# Patient Record
Sex: Male | Born: 1980 | Race: White | Hispanic: No | Marital: Married
Health system: Midwestern US, Community
[De-identification: ages and names within clinical notes are randomized; demographics above are authoritative.]

## PROBLEM LIST (undated history)

## (undated) DIAGNOSIS — Z01818 Encounter for other preprocedural examination: Secondary | ICD-10-CM

## (undated) DIAGNOSIS — I2699 Other pulmonary embolism without acute cor pulmonale: Principal | ICD-10-CM

## (undated) DIAGNOSIS — Z832 Family history of diseases of the blood and blood-forming organs and certain disorders involving the immune mechanism: Secondary | ICD-10-CM

## (undated) DIAGNOSIS — K2950 Unspecified chronic gastritis without bleeding: Secondary | ICD-10-CM

## (undated) HISTORY — PX: KNEE SURGERY: SHX244

---

## 2013-11-08 ENCOUNTER — Inpatient Hospital Stay
Admit: 2013-11-08 | Discharge: 2013-11-08 | Disposition: A | Discharge status: Home / Self Care | Attending: Emergency Medicine

## 2013-11-08 MED ORDER — IBUPROFEN 800 MG PO TABS
800 MG | ORAL_TABLET | Freq: Three times a day (TID) | ORAL | Status: DC | PRN
Start: 2013-11-08 — End: 2015-03-01

## 2013-11-08 MED ORDER — SULFAMETHOXAZOLE-TRIMETHOPRIM 800-160 MG PO TABS
800-160 MG | ORAL_TABLET | Freq: Two times a day (BID) | ORAL | Status: AC
Start: 2013-11-08 — End: 2013-11-18

## 2013-11-08 NOTE — Discharge Instructions (Signed)
Wound Care: After Your Visit  Your Care Instructions  Taking good care of your wound at home will help it heal quickly and reduce your chance of infection.  The doctor has checked you carefully, but problems can develop later. If you notice any problems or new symptoms, get medical treatment right away.  Follow-up care is a key part of your treatment and safety. Be sure to make and go to all appointments, and call your doctor if you are having problems. It's also a good idea to know your test results and keep a list of the medicines you take.  How can you care for yourself at home?   Clean the area with soap and water 2 times a day unless your doctor gives you different instructions. Don't use hydrogen peroxide or alcohol, which can slow healing.   You may cover the wound with a thin layer of antibiotic ointment, such as bacitracin, and a nonstick bandage.   Apply more ointment and replace the bandage as needed.   Take pain medicines exactly as directed. Some pain is normal with a wound, but do not ignore pain that is getting worse instead of better. You could have an infection.   If the doctor gave you a prescription medicine for pain, take it as prescribed.   If you are not taking a prescription pain medicine, ask your doctor if you can take an over-the-counter medicine.   Your doctor may have closed your wound with stitches (sutures), staples, or skin glue.   If you have stitches, your doctor may remove them after several days to 2 weeks. Or you may have stitches that dissolve on their own.   If you have staples, your doctor may remove them after 7 to 10 days.   If your wound was closed with skin glue, the glue will wear off in a few days to 2 weeks.  When should you call for help?  Call your doctor now or seek immediate medical care if:   You have signs of infection, such as:   Increased pain, swelling, warmth, or redness near the wound.   Red streaks leading from the wound.   Pus draining from  the wound.   A fever.   You bleed so much from your incision that you soak one or more bandages over 2 to 4 hours.  Watch closely for changes in your health, and be sure to contact your doctor if:   The wound is not getting better each day.   Where can you learn more?   Go to [email or web address] and sign in to your MyChart account. Enter M973 in the Search Health Information box to learn more about "Wound Care: After Your Visit."    If you do not have an account, please click on the "Sign Up Now" link.      2006-2014 [employer]. Care instructions adapted under license by [facility]. This care instruction is for use with your licensed healthcare professional. If you have questions about a medical condition or this instruction, always ask your healthcare professional. [employer] disclaims any warranty or liability for your use of this information.  Content Version: 10.0.273164; Last Revised: June 18, 2010

## 2013-11-08 NOTE — ED Provider Notes (Signed)
HPI:  11/08/13,   Time: 7:12 PM         Walter Farmer is a 33 y.o. male presenting to the ED for red open wound in umbilicus, beginning 1 week ago.  The complaint has been persistent, moderate in severity, and worsened by nothing.      ROS:   Pertinent positives and negatives are stated within HPI, all other systems reviewed and are negative.  --------------------------------------------- PAST HISTORY ---------------------------------------------  Past Medical History:  has no past medical history on file.    Past Surgical History:  has past surgical history that includes Tonsillectomy.    Social History:      Family History: family history is not on file.     The patient???s home medications have been reviewed.    Allergies: Review of patient's allergies indicates no known allergies.    -------------------------------------------------- RESULTS -------------------------------------------------  All laboratory and radiology results have been personally reviewed by myself   LABS:  No results found for this visit on 11/08/13.    RADIOLOGY:  Interpreted by Radiologist.       ------------------------- NURSING NOTES AND VITALS REVIEWED ---------------------------   The nursing notes within the ED encounter and vital signs as below have been reviewed.   BP 121/78 mmHg   Pulse 79   Temp(Src) 98.9 ??F (37.2 ??C) (Oral)   Resp 12   Wt 220 lb (99.791 kg)  Oxygen Saturation Interpretation: Normal      ---------------------------------------------------PHYSICAL EXAM--------------------------------------      Constitutional/General: Alert and oriented x3, well appearing, non toxic in NAD  Head: NC/AT  Eyes: PERRL, EOMI  Mouth: Oropharynx clear, handling secretions, no trismus  Neck: Supple, full ROM, no meningeal signs  Pulmonary: Lungs clear to auscultation bilaterally, no wheezes, rales, or rhonchi. Not in respiratory distress  Cardiovascular:  Regular rate and rhythm, no murmurs, gallops, or rubs. 2+ distal pulses  Abdomen:  Soft, non tender, non distended,   Extremities: Moves all extremities x 4. Warm and well perfused  Skin: purulent drainage from wound in umbilicus  Neurologic: GCS 15,  Psych: Normal Affect      ------------------------------ ED COURSE/MEDICAL DECISION MAKING----------------------  Medications - No data to display      Medical Decision Making:    Wound C&S done. Prescription for Septra DS and Ibuprofen given.    Counseling:   The emergency provider has spoken with the patient and discussed today???s results, in addition to providing specific details for the plan of care and counseling regarding the diagnosis and prognosis.  Questions are answered at this time and they are agreeable with the plan.      --------------------------------- IMPRESSION AND DISPOSITION ---------------------------------    IMPRESSION  1. Infected wound, initial encounter [facility])        DISPOSITION  Disposition: Discharge to home  Patient condition is good                  Delma Officer, MD  11/08/13 [phone]

## 2013-11-11 LAB — CULTURE, WOUND

## 2014-02-18 ENCOUNTER — Inpatient Hospital Stay
Admit: 2014-02-18 | Discharge: 2014-02-18 | Disposition: A | Discharge status: Home / Self Care | Attending: Internal Medicine

## 2014-02-18 ENCOUNTER — Encounter: Admit: 2014-02-18

## 2014-02-18 DIAGNOSIS — R0789 Other chest pain: Secondary | ICD-10-CM

## 2014-02-18 LAB — EKG 12-LEAD
ECG Heart Rate: 59 /min
ECG P Duration: 116 ms
ECG QRS Axis: 62 deg
ECG QRS Duration: 96 ms
ECG QT Dispersion: 26 ms
ECG QTC Interval: 376 ms
ECG RR Interval: 1016 ms
P Axis: 55 deg
P-R Interval: 152 ms
Q-T Interval: 380 ms
T Axis: 49 deg

## 2014-02-18 LAB — CBC
Hematocrit: 43.5 % (ref 37.0–54.0)
Hemoglobin: 14.6 g/dL (ref 12.5–16.5)
MCH: 30.6 pg (ref 26.0–35.0)
MCHC: 33.5 % (ref 32.0–34.5)
MCV: 91.2 fL (ref 80.0–99.9)
MPV: 7.9 fL (ref 7.0–12.0)
PLATELET SLIDE REVIEW: NORMAL
Platelets: 289 E9/L (ref 130–450)
RBC: 4.77 E12/L (ref 3.80–5.80)
RDW: 13.3 fL (ref 11.5–15.0)
WBC: 13.3 E9/L — ABNORMAL HIGH (ref 4.5–11.5)

## 2014-02-18 LAB — COMPREHENSIVE METABOLIC PANEL
ALT: 23 U/L (ref 0–40)
AST: 21 U/L (ref 0–39)
Albumin: 4.4 g/dL (ref 3.5–5.2)
Alkaline Phosphatase: 66 U/L (ref 40–129)
Anion Gap: 12 mmol/L (ref 7–16)
BUN: 19 mg/dL (ref 6–20)
CO2: 26 mmol/L (ref 22–29)
Calcium: 9.6 mg/dL (ref 8.6–10.2)
Chloride: 102 mmol/L (ref 98–107)
Creatinine: 1.2 mg/dL (ref 0.7–1.2)
GFR African American: >60
GFR Non-African American: >60 mL/min/{1.73_m2} (ref >=60)
Glucose: 90 mg/dL (ref 74–109)
Potassium: 3.9 mmol/L (ref 3.5–5.0)
Sodium: 140 mmol/L (ref 132–146)
Total Bilirubin: <0.2 mg/dL (ref 0.0–1.2)
Total Protein: 7.3 g/dL (ref 6.4–8.3)

## 2014-02-18 LAB — URINE DRUG SCREEN
Amphetamine Screen, Urine: NOT DETECTED (ref <1000)
Barbiturate Screen, Ur: NOT DETECTED (ref <200)
Benzodiazepine Screen, Urine: NOT DETECTED (ref <200)
Cannabinoid Scrn, Ur: POSITIVE — AB
Cocaine Metabolite Screen, Urine: NOT DETECTED (ref <300)
Methadone Screen, Urine: NOT DETECTED (ref <300)
Opiate Scrn, Ur: NOT DETECTED
PCP Screen, Urine: NOT DETECTED (ref <25)
Propoxyphene Scrn, Ur: NOT DETECTED (ref <300)

## 2014-02-18 LAB — DIFFERENTIAL WITH WBC
Basophils %: 0 % (ref 0–2)
Basophils Absolute: 0 E9/L (ref 0.00–0.20)
Eosinophils %: 6 % (ref 0–6)
Eosinophils Absolute: 0.8 E9/L — ABNORMAL HIGH (ref 0.05–0.50)
Lymphocytes %: 51 % — ABNORMAL HIGH (ref 20–42)
Lymphocytes Absolute: 6.78 E9/L — ABNORMAL HIGH (ref 1.50–4.00)
Monocytes %: 1 % — ABNORMAL LOW (ref 2–12)
Monocytes Absolute: 0.13 E9/L (ref 0.10–0.95)
Neutrophils %: 42 % — ABNORMAL LOW (ref 43–80)
Neutrophils Absolute: 5.59 E9/L (ref 1.80–7.30)
RBC Morphology: NORMAL

## 2014-02-18 LAB — TROPONIN: Troponin: <0.01 ng/mL (ref 0.00–0.03)

## 2014-02-18 LAB — D-DIMER, QUANTITATIVE: D-Dimer, Quant: <200 ng/mL DDU

## 2014-02-18 MED ORDER — ACETAMINOPHEN-CODEINE #3 300-30 MG PO TABS
300-30 MG | Freq: Once | ORAL | Status: AC
Start: 2014-02-18 — End: 2014-02-18
  Administered 2014-02-18: 08:00:00 1 via ORAL

## 2014-02-18 MED ORDER — ACETAMINOPHEN-CODEINE 300-30 MG PO TABS
300-30 MG | ORAL_TABLET | ORAL | Status: DC | PRN
Start: 2014-02-18 — End: 2015-02-16

## 2014-02-18 MED ORDER — KETOROLAC TROMETHAMINE 30 MG/ML IJ SOLN
30 MG/ML | Freq: Once | INTRAMUSCULAR | Status: AC
Start: 2014-02-18 — End: 2014-02-18
  Administered 2014-02-18: 07:00:00 30 mg via INTRAVENOUS

## 2014-02-18 MED ORDER — IBUPROFEN 800 MG PO TABS
800 MG | ORAL_TABLET | Freq: Three times a day (TID) | ORAL | Status: DC | PRN
Start: 2014-02-18 — End: 2015-03-01

## 2014-02-18 MED FILL — ACETAMINOPHEN-CODEINE #3 300-30 MG PO TABS: 300-30 MG | ORAL | Qty: 1

## 2014-02-18 MED FILL — KETOROLAC TROMETHAMINE 30 MG/ML IJ SOLN: 30 MG/ML | INTRAMUSCULAR | Qty: 1

## 2014-02-18 NOTE — ED Provider Notes (Signed)
[facility] ED  eMERGENCY dEPARTMENT eNCOUnter      Pt Name: Walter Farmer  MRN: [id number]  Birthdate 08/04/1980  Date of evaluation: 02/18/2014  Provider: Marylee Floras, MD    CHIEF COMPLAINT       Chief Complaint   Patient presents with   ??? Chest Pain     started 2 hours ago.  L. sided.  tingling of fingers L. hand         HISTORY OF PRESENT ILLNESS  (Location/Symptom, Timing/Onset, Context/Setting, Quality, Duration, Modifying Factors, Severity.)   Walter Farmer is a 33 y.o. male who presents to the emergency department with sudden onset of left sided chest pain described as a sharp stabbing pain in the inframammary area with radiation to the mid axillary line. He describes the pain as a sharp stabbing pain 6 out of 10 at its worst without radiation however he did describe paresthesias bilateral upper extremities after the onset of the symptoms. The pain has persisted prompting him to report to the emergency department. He denies any cough hemoptysis, swollen or painful legs and denies any recent surgery or prolonged bed rest. He denies adamantly that he is not a user of illicit drugs but does smoke cigarettes routinely.    HPI    Nursing Notes were reviewed and I agree.    REVIEW OF SYSTEMS    (2-9 systems for level 4, 10 or more for level 5)     Review of Systems   Constitutional: Negative for fever, chills, diaphoresis, activity change, appetite change and fatigue.   HENT: Negative for congestion, hearing loss, postnasal drip, rhinorrhea, sinus pressure, sore throat and trouble swallowing.    Eyes: Negative for discharge, redness and visual disturbance.   Respiratory: Negative for apnea, cough, chest tightness, shortness of breath and wheezing.    Cardiovascular: Positive for chest pain. Negative for palpitations and leg swelling.   Gastrointestinal: Negative for nausea and vomiting.   Genitourinary: Negative for dysuria, urgency, frequency and flank pain.   Musculoskeletal: Negative for  myalgias, arthralgias and neck stiffness.   Skin: Negative for color change and pallor.   Neurological: Positive for numbness. Negative for dizziness, tremors, seizures, syncope, weakness and light-headedness.   Hematological: Negative for adenopathy. Does not bruise/bleed easily.   All other systems reviewed and are negative.    Except as noted above the remainder of the review of systems was reviewed and negative.       PAST MEDICAL HISTORY   History reviewed. No pertinent past medical history.      SURGICAL HISTORY       Past Surgical History   Procedure Laterality Date   ??? Tonsillectomy     ??? Knee surgery Right          CURRENT MEDICATIONS       Previous Medications    IBUPROFEN (ADVIL;MOTRIN) 800 MG TABLET    Take 1 tablet by mouth every 8 hours as needed for Pain       ALLERGIES     Review of patient's allergies indicates no known allergies.    FAMILY HISTORY     History reviewed. No pertinent family history.       SOCIAL HISTORY       History     Social History   ??? Marital Status: Single     Spouse Name: N/A     Number of Children: N/A   ??? Years of Education: N/A     Social  History Main Topics   ??? Smoking status: Heavy Tobacco Smoker -- 0.50 packs/day   ??? Smokeless tobacco: None   ??? Alcohol Use: No   ??? Drug Use: No   ??? Sexual Activity:     Partners: Female     Other Topics Concern   ??? None     Social History Narrative         PHYSICAL EXAM    (up to 7 for level 4, 8 or more for level 5)   ED Triage Vitals   BP Temp Temp Source Pulse Resp SpO2 Height Weight   02/18/14 0128 02/18/14 0128 02/18/14 0128 02/18/14 0128 02/18/14 0128 02/18/14 0128 02/18/14 0128 02/18/14 0128   133/75 mmHg 98.6 ??F (37 ??C) Oral 77 20 99 % 5\' 8"  (1.727 m) 215 lb (97.523 kg)       Physical Exam   Constitutional: He is oriented to person, place, and time. He appears well-developed and well-nourished.   HENT:   Head: Normocephalic and atraumatic.   Right Ear: External ear normal.   Left Ear: External ear normal.   Eyes: Conjunctivae and  EOM are normal. Pupils are equal, round, and reactive to light. Right eye exhibits no discharge. Left eye exhibits no discharge.   Neck: Normal range of motion. Neck supple. No JVD present.   Cardiovascular: Normal rate, regular rhythm, normal heart sounds and intact distal pulses.  Exam reveals no gallop and no friction rub.    No murmur heard.  Pulmonary/Chest: Effort normal and breath sounds normal. No respiratory distress. He has no wheezes. He has no rales. He exhibits tenderness.   Pain on palpation at the inframammary line mid clavicular no crepitus is appreciated no deformity.   Abdominal: Soft. Bowel sounds are normal. He exhibits no distension and no mass. There is no tenderness. There is no rebound and no guarding.   Musculoskeletal: Normal range of motion. He exhibits no edema or tenderness.   Neurological: He is alert and oriented to person, place, and time. He has normal reflexes. No cranial nerve deficit. He exhibits normal muscle tone. Coordination normal.   Skin: Skin is warm and dry. No rash noted. No erythema.   Psychiatric: He has a normal mood and affect. Judgment normal.   Nursing note and vitals reviewed.        DIFFERENTIAL DIAGNOSIS   Vitals:    Filed Vitals:    02/18/14 0128   BP: 133/75   Pulse: 77   Temp: 98.6 ??F (37 ??C)   TempSrc: Oral   Resp: 20   Height: 5\' 8"  (1.727 m)   Weight: 215 lb (97.523 kg)   SpO2: 99%           MDM  Number of Diagnoses or Management Options  Chest wall pain:   Pleuritic pain:   Diagnosis management comments: This is a 33 year old male with no medical history of significance and presents with 2 hours of a pleuritic type pain of the left inframammary area which she was concerned about when he developed paresthesias of the upper extremities which he thinks was bilateral but can't say for sure. He has no cardiovascular risk factors other than tobacco use and family history. He felt perfectly fine prior to the onset of these symptoms and rates the pain at this 0.5  out of 10 worsened with palpation. His EKG is reassuring and shows no obvious ischemia.  Chest x-ray has suggestion of air space disease but no pneumothorax or effusion is noted.  He has a slight leukocytosis 13.3 and no anemia. Based on the patient's EKG vital signs physical exam and cardiovascular risk factors as well as the fact that this is reproducible with palpation I have very low suspicion for cardiac etiology.  D-dimer as well as troponin are negative. The patient's pain is now abated. I have suspicion for this being inflammation of the chest wall based on the history present illness, physical exam, EKG and other diagnostic. He is encouraged to follow up with his primary care doctor within 24-48 hours and to return to the emergency department if any change in symptoms occurs particularly if he develops any shortness of breath or hemoptysis.        DIAGNOSTIC RESULTS     EKG: All EKG's are interpreted by Marylee FlorasMichael Melvine Julin, MD in the absence of a cardiologist.    Vital sinus rhythm 59 bpm normal axis no hypertrophy no ischemia noted.    RADIOLOGY:   Non-plain film images such as CT, Ultrasound and MRI are read by the radiologist. Plain radiographic images are visualized and preliminarily interpreted Rozanna BoerbyMichael Marvell Tamer, MD with the below findings:    X-ray shows no obvious consolidation effusion pneumothorax.    Interpretation per the Radiologist below, if available at the time of this note:    XR CHEST STANDARD TWO VW    (Results Pending)         ED BEDSIDE ULTRASOUND:   Performed by ED Physician - none    LABS:  Labs Reviewed   CBC - Abnormal; Notable for the following:     WBC 13.3 (*)     All other components within normal limits   URINE DRUG SCREEN   COMPREHENSIVE METABOLIC PANEL   TROPONIN   D-DIMER, QUANTITATIVE   DIFFERENTIAL WITH WBC       All other labs were within normal range or not returned as of this dictation.    EMERGENCY DEPARTMENT COURSE and DIFFERENTIAL DIAGNOSIS/MDM:   Vitals:    Filed Vitals:     02/18/14 0128   BP: 133/75   Pulse: 77   Temp: 98.6 ??F (37 ??C)   TempSrc: Oral   Resp: 20   Height: 5\' 8"  (1.727 m)   Weight: 215 lb (97.523 kg)   SpO2: 99%           CONSULTS:  None    PROCEDURES:  Unless otherwise noted below, none     Procedures      FINAL IMPRESSION    No diagnosis found.      DISPOSITION/PLAN   DISPOSITION     PATIENT REFERRED TO:  No follow-up provider specified.    DISCHARGE MEDICATIONS:  New Prescriptions    No medications on file       (Please note that portions of this note were completed with a voice recognition program.  Efforts were made to edit the dictations but occasionally words are mis-transcribed.)    Marylee FlorasMichael Jereline Ticer, MD  Attending Emergency Physician        Marylee FlorasMichael Crestina Strike, MD  02/18/14 [phone]

## 2014-02-18 NOTE — Progress Notes (Signed)
To x-ray via cart

## 2014-02-21 LAB — CANNABINOID, URINE, CONFIRMATION: Cannabinoids Conf Ur: 178 ng/mL

## 2015-02-15 ENCOUNTER — Emergency Department
Admission: EM | Admit: 2015-02-15 | Discharge: 2015-02-15 | Discharge status: Against Medical Advice | Payer: Medicaid - Out of State | Attending: Emergency Medicine | Admitting: Emergency Medicine

## 2015-02-15 ENCOUNTER — Encounter: Payer: Self-pay | Admitting: Urgent Care

## 2015-02-15 ENCOUNTER — Emergency Department: Payer: Medicaid - Out of State

## 2015-02-15 ENCOUNTER — Other Ambulatory Visit: Payer: Self-pay

## 2015-02-15 DIAGNOSIS — J159 Unspecified bacterial pneumonia: Secondary | ICD-10-CM | POA: Insufficient documentation

## 2015-02-15 DIAGNOSIS — J189 Pneumonia, unspecified organism: Secondary | ICD-10-CM

## 2015-02-15 DIAGNOSIS — R079 Chest pain, unspecified: Secondary | ICD-10-CM | POA: Diagnosis present

## 2015-02-15 DIAGNOSIS — F17200 Nicotine dependence, unspecified, uncomplicated: Secondary | ICD-10-CM | POA: Insufficient documentation

## 2015-02-15 DIAGNOSIS — I2699 Other pulmonary embolism without acute cor pulmonale: Secondary | ICD-10-CM | POA: Diagnosis not present

## 2015-02-15 LAB — BASIC METABOLIC PANEL
Anion gap: 8 (ref 5–15)
BUN: 12 mg/dL (ref 6–20)
CHLORIDE: 106 mmol/L (ref 101–111)
CO2: 23 mmol/L (ref 22–32)
CREATININE: 1.01 mg/dL (ref 0.61–1.24)
Calcium: 9 mg/dL (ref 8.9–10.3)
GFR calc non Af Amer: >60 mL/min (ref >60)
Glucose, Bld: 106 mg/dL — ABNORMAL HIGH (ref 65–99)
POTASSIUM: 3.9 mmol/L (ref 3.5–5.1)
Sodium: 137 mmol/L (ref 135–145)

## 2015-02-15 LAB — CBC
HEMATOCRIT: 44.8 % (ref 40.0–52.0)
Hemoglobin: 15.2 g/dL (ref 13.0–18.0)
MCH: 30.5 pg (ref 26.0–34.0)
MCHC: 33.8 g/dL (ref 32.0–36.0)
MCV: 90.1 fL (ref 80.0–100.0)
PLATELETS: 310 10*3/uL (ref 150–440)
RBC: 4.98 MIL/uL (ref 4.40–5.90)
RDW: 13.4 % (ref 11.5–14.5)
WBC: 11.1 10*3/uL — ABNORMAL HIGH (ref 3.8–10.6)

## 2015-02-15 LAB — TROPONIN I: Troponin I: <0.03 ng/mL (ref <0.031)

## 2015-02-15 LAB — FIBRIN DERIVATIVES D-DIMER (ARMC ONLY): Fibrin derivatives D-dimer (ARMC): 330 (ref 0–499)

## 2015-02-15 MED ORDER — RIVAROXABAN 15 MG PO TABS: 15.0000 mg | ORAL_TABLET | Freq: Every day | ORAL | Status: AC

## 2015-02-15 MED ORDER — ASPIRIN 81 MG PO CHEW
324.0000 mg | CHEWABLE_TABLET | Freq: Once | ORAL | Status: AC
  Administered 2015-02-15: 324 mg via ORAL

## 2015-02-15 MED ORDER — RIVAROXABAN 15 MG PO TABS
15.0000 mg | ORAL_TABLET | Freq: Once | ORAL | Status: AC
  Administered 2015-02-15: 15 mg via ORAL
  Filled 2015-02-15: qty 1

## 2015-02-15 MED ORDER — LEVOFLOXACIN IN D5W 500 MG/100ML IV SOLN
500.0000 mg | Freq: Once | INTRAVENOUS | Status: AC
  Administered 2015-02-15: 500 mg via INTRAVENOUS
  Filled 2015-02-15: qty 100

## 2015-02-15 MED ORDER — LEVOFLOXACIN 500 MG PO TABS: 500.0000 mg | ORAL_TABLET | Freq: Every day | ORAL | Status: AC

## 2015-02-15 MED ORDER — IOHEXOL 350 MG/ML SOLN
100.0000 mL | Freq: Once | INTRAVENOUS | Status: AC | PRN
  Administered 2015-02-15: 100 mL via INTRAVENOUS

## 2015-02-15 MED ORDER — ASPIRIN 81 MG PO CHEW
CHEWABLE_TABLET | ORAL | Status: AC
Start: 2015-02-15 — End: 2015-02-15
  Administered 2015-02-15: 324 mg via ORAL
  Filled 2015-02-15: qty 4

## 2015-02-15 NOTE — ED Notes (Signed)
Pt left AMA due to having pet in car.  Pt states he lives in [place] and will be returning there today.  Dr Manson PasseyBrown counseled against leaving.  Pt states he will go directly to ER in [place].  Pt given paperwork to take to ED in [place].  Instructions given to pt regarding prescriptions for xarelto and levaquin.  Voiced understanding.  Teach back verified.  No questions or concerns at this time.  Pt understands importance of following up with ED in [place].

## 2015-02-15 NOTE — ED Notes (Signed)
Pt back from CT

## 2015-02-15 NOTE — ED Notes (Signed)
Patient transported to CT 

## 2015-02-15 NOTE — Discharge Instructions (Signed)
Community-Acquired Pneumonia, Adult Pneumonia is an infection of the lungs. There are different types of pneumonia. One type can develop while a person is in a hospital. A different type, called community-acquired pneumonia, develops in people who are not, or have not recently been, in the hospital or other health care facility.  CAUSES Pneumonia may be caused by bacteria, viruses, or funguses. Community-acquired pneumonia is often caused by Streptococcus pneumonia bacteria. These bacteria are often passed from one person to another by breathing in droplets from the cough or sneeze of an infected person. RISK FACTORS The condition is more likely to develop in:  People who havechronic diseases, such as chronic obstructive pulmonary disease (COPD), asthma, congestive heart failure, cystic fibrosis, diabetes, or kidney disease.  People who haveearly-stage or late-stage HIV.  People who havesickle cell disease.  People who havehad their spleen removed (splenectomy).  People who havepoor Human resources officer.  People who havemedical conditions that increase the risk of breathing in (aspirating) secretions their own mouth and nose.   People who havea weakened immune system (immunocompromised).  People who smoke.  People whotravel to areas where pneumonia-causing germs commonly exist.  People whoare around animal habitats or animals that have pneumonia-causing germs, including birds, bats, rabbits, cats, and farm animals. SYMPTOMS Symptoms of this condition include:  Adry cough.  A wet (productive) cough.  Fever.  Sweating.  Chest pain, especially when breathing deeply or coughing.  Rapid breathing or difficulty breathing.  Shortness of breath.  Shaking chills.  Fatigue.  Muscle aches. DIAGNOSIS Your health care provider will take a medical history and perform a physical exam. You may also have other tests, including:  Imaging studies of your chest, including  X-rays.  Tests to check your blood oxygen level and other blood gases.  Other tests on blood, mucus (sputum), fluid around your lungs (pleural fluid), and urine. If your pneumonia is severe, other tests may be done to identify the specific cause of your illness. TREATMENT The type of treatment that you receive depends on many factors, such as the cause of your pneumonia, the medicines you take, and other medical conditions that you have. For most adults, treatment and recovery from pneumonia may occur at home. In some cases, treatment must happen in a hospital. Treatment may include:  Antibiotic medicines, if the pneumonia was caused by bacteria.  Antiviral medicines, if the pneumonia was caused by a virus.  Medicines that are given by mouth or through an IV tube.  Oxygen.  Respiratory therapy. Although rare, treating severe pneumonia may include:  Mechanical ventilation. This is done if you are not breathing well on your own and you cannot maintain a safe blood oxygen level.  Thoracentesis. This procedureremoves fluid around one lung or both lungs to help you breathe better. HOME CARE INSTRUCTIONS  Take over-the-counter and prescription medicines only as told by your health care provider.  Only takecough medicine if you are losing sleep. Understand that cough medicine can prevent your body's natural ability to remove mucus from your lungs.  If you were prescribed an antibiotic medicine, take it as told by your health care provider. Do not stop taking the antibiotic even if you start to feel better.  Sleep in a semi-upright position at night. Try sleeping in a reclining chair, or place a few pillows under your head.  Do not use tobacco products, including cigarettes, chewing tobacco, and e-cigarettes. If you need help quitting, ask your health care provider.  Drink enough water to keep your urine  clear or pale yellow. This will help to thin out mucus secretions in your  lungs. PREVENTION There are ways that you can decrease your risk of developing community-acquired pneumonia. Consider getting a pneumococcal vaccine if:  You are older than 35 years of age.  You are older than 36 years of age and are undergoing cancer treatment, have chronic lung disease, or have other medical conditions that affect your immune system. Ask your health care provider if this applies to you. There are different types and schedules of pneumococcal vaccines. Ask your health care provider which vaccination option is best for you. You may also prevent community-acquired pneumonia if you take these actions:  Get an influenza vaccine every year. Ask your health care provider which type of influenza vaccine is best for you.  Go to the dentist on a regular basis.  Wash your hands often. Use hand sanitizer if soap and water are not available. SEEK MEDICAL CARE IF:  You have a fever.  You are losing sleep because you cannot control your cough with cough medicine. SEEK IMMEDIATE MEDICAL CARE IF:  You have worsening shortness of breath.  You have increased chest pain.  Your sickness becomes worse, especially if you are an older adult or have a weakened immune system.  You cough up blood.   This information is not intended to replace advice given to you by your health care provider. Make sure you discuss any questions you have with your health care provider.   Document Released: 02/11/2005 Document Revised: 11/02/2014 Document Reviewed: 06/08/2014 Elsevier Interactive Patient Education 2016 [employer]  Pulmonary Embolism A pulmonary embolism (PE) is a sudden blockage or decrease of blood flow in one lung or both lungs. Most blockages come from a blood clot that travels from the legs or the pelvis to the lungs. PE is a dangerous and potentially life-threatening condition if it is not treated right away. CAUSES A pulmonary embolism occurs most commonly when a blood clot  travels from one of your veins to your lungs. Rarely, PE is caused by air, fat, amniotic fluid, or part of a tumor traveling through your veins to your lungs. RISK FACTORS A PE is more likely to develop in:  People who smoke.  People who areolder, especially over 22 years of age.  People who are overweight (obese).  People who sit or lie still for a long time, such as during long-distance travel (over 4 hours), bed rest, hospitalization, or during recovery from certain medical conditions like a stroke.  People who do not engage in much physical activity (sedentary lifestyle).  People who have chronic breathing disorders.  People whohave a personal or family history of blood clots or blood clotting disease.  People whohave peripheral vascular disease (PVD), diabetes, or some types of cancer.  People who haveheart disease, especially if the person had a recent heart attack or has congestive heart failure.  People who have neurological diseases that affect the legs (leg paresis).  People who have had a traumatic injury, such as breaking a hip or leg.  People whohave recently had major or lengthy surgery, especially on the hip, knee, or abdomen.  People who have hada central line placed inside a large vein.  People who takemedicines that contain the hormone estrogen. These include birth control pills and hormone replacement therapy.  Pregnancy or during childbirth or the postpartum period. SIGNS AND SYMPTOMS  The symptoms of a PE usually start suddenly and include:  Shortness of breath while active  or at rest.  Coughing or coughing up blood or blood-tinged mucus.  Chest pain that is often worse with deep breaths.  Rapid or irregular heartbeat.  Feeling light-headed or dizzy.  Fainting.  Feelinganxious.  Sweating. There may also be pain and swelling in a leg if that is where the blood clot started. These symptoms may represent a serious problem that is an  emergency. Do not wait to see if the symptoms will go away. Get medical help right away. Call your local emergency services (911 in the [place] Do not drive yourself to the hospital. DIAGNOSIS Your health care provider will take a medical history and perform a physical exam. You may also have other tests, including:  Blood tests to assess the clotting properties of your blood, assess oxygen levels in your blood, and find blood clots.  Imaging tests, such as CT, ultrasound, MRI, X-ray, and other tests to see if you have clots anywhere in your body.  An electrocardiogram (ECG) to look for heart strain from blood clots in the lungs. TREATMENT The main goals of PE treatment are:  To stop a blood clot from growing larger.  To stop new blood clots from forming. The type of treatment that you receive depends on many factors, such as the cause of your PE, your risk for bleeding or developing more clots, and other medical conditions that you have. Sometimes, a combination of treatments is necessary. This condition may be treated with:  Medicines, including newer oral blood thinners (anticoagulants), warfarin, low molecular weight heparins, thrombolytics, or heparins.  Wearing compression stockings or using different types of devices.  Surgery (rare) to remove the blood clot or to place a filter in your abdomen to stop the blood clot from traveling to your lungs. Treatments for a PE are often divided into immediate treatment, long-term treatment (up to 3 months after PE), and extended treatment (more than 3 months after PE). Your treatment may continue for several months. This is called maintenance therapy, and it is used to prevent the forming of new blood clots. You can work with your health care provider to choose the treatment program that is best for you. What are anticoagulants? Anticoagulants are medicines that treat PEs. They can stop current blood clots from growing and stop new clots from  forming. They cannot dissolve existing clots. Your body dissolves clots by itself over time. Anticoagulants are given by mouth, by injection, or through an IV tube. What are thrombolytics? Thrombolytics are clot-dissolving medicines that are used to dissolve a PE. They carry a high risk of bleeding, so they tend to be used only in severe cases or if you have very low blood pressure. HOME CARE INSTRUCTIONS If you are taking a newer oral anticoagulant:  Take the medicine every single day at the same time each day.  Understand what foods and drugs interact with this medicine.  Understand that there are no regular blood tests required when using this medicine.  Understandthe side effects of this medicine, including excessive bruising or bleeding. Ask your health care provider or pharmacist about other possible side effects. If you are taking warfarin:  Understand how to take warfarin and know which foods can affect how warfarin works in Public relations account executive.  Understand that it is dangerous to taketoo much or too little warfarin. Too much warfarin increases the risk of bleeding. Too little warfarin continues to allow the risk for blood clots.  Follow your PT and INR blood testing schedule. The PT and INR  results allow your health care provider to adjust your dose of warfarin. It is very important that you have your PT and INR tested as often as told by your health care provider.  Avoid major changes in your diet, or tell your health care provider before you change your diet. Arrange a visit with a registered dietitian to answer your questions. Many foods, especially foods that are high in vitamin K, can interfere with warfarin and affect the PT and INR results. Eat a consistent amount of foods that are high in vitamin K, such as:  Spinach, kale, broccoli, cabbage, collard greens, turnip greens, Brussels sprouts, peas, cauliflower, seaweed, and parsley.  Beef liver and pork liver.  Green  tea.  Soybean oil.  Tell your health care provider about any and all medicines, vitamins, and supplements that you take, including aspirin and other over-the-counter anti-inflammatory medicines. Be especially cautious with aspirin and anti-inflammatory medicines. Do not take those before you ask your health care provider if it is safe to do so. This is important because many medicines can interfere with warfarin and affect the PT and INR results.  Do not start or stop taking any over-the-counter or prescription medicine unless your health care provider or pharmacist tells you to do so. If you take warfarin, you will also need to do these things:  Hold pressure over cuts for longer than usual.  Tell your dentist and other health care providers that you are taking warfarin before you have any procedures in which bleeding may occur.  Avoid alcohol or drink very small amounts. Tell your health care provider if you change your alcohol intake.  Do not use tobacco products, including cigarettes, chewing tobacco, and e-cigarettes. If you need help quitting, ask your health care provider.  Avoid contact sports. General Instructions  Take over-the-counter and prescription medicines only as told by your health care provider. Anticoagulant medicines can have side effects, including easy bruising and difficulty stopping bleeding. If you are prescribed an anticoagulant, you will also need to do these things:  Hold pressure over cuts for longer than usual.  Tell your dentist and other health care providers that you are taking anticoagulants before you have any procedures in which bleeding may occur.  Avoid contact sports.  Wear a medical alert bracelet or carry a medical alert card that says you have had a PE.  Ask your health care provider how soon you can go back to your normal activities. Stay active to prevent new blood clots from forming.  Make sure to exercise while traveling or when you have  been sitting or standing for a long period of time. It is very important to exercise. Exercise your legs by walking or by tightening and relaxing your leg muscles often. Take frequent walks.  Wear compression stockings as told by your health care provider to help prevent more blood clots from forming.  Do not use tobacco products, including cigarettes, chewing tobacco, and e-cigarettes. If you need help quitting, ask your health care provider.  Keep all follow-up appointments with your health care provider. This is important. PREVENTION Take these actions to decrease your risk of developing another PE:  Exercise regularly. For at least 30 minutes every day, engage in:  Activity that involves moving your arms and legs.  Activity that encourages good blood flow through your body by increasing your heart rate.  Exercise your arms and legs every hour during long-distance travel (over 4 hours). Drink plenty of water and avoid drinking alcohol  while traveling.  Avoid sitting or lying in bed for long periods of time without moving your legs.  Maintain a weight that is appropriate for your height. Ask your health care provider what weight is healthy for you.  If you are a woman who is over 35 years of age, avoid unnecessary use of medicines that contain estrogen. These include birth control pills.  Do not smoke, especially if you take estrogen medicines. If you need help quitting, ask your health care provider.  If you are at very high risk for PE, wear compression stockings.  If you recently had a PE, have regularly scheduled ultrasound testing on your legs to check for new blood clots. If you are hospitalized, prevention measures may include:  Early walking after surgery, as soon as your health care provider says that it is safe.  Receiving anticoagulants to prevent blood clots. If you cannot take anticoagulants, other options may be available, such as wearing compression stockings or  using different types of devices. SEEK IMMEDIATE MEDICAL CARE IF:  You have new or increased pain, swelling, or redness in an arm or leg.  You have numbness or tingling in an arm or leg.  You have shortness of breath while active or at rest.  You have chest pain.  You have a rapid or irregular heartbeat.  You feel light-headed or dizzy.  You cough up blood.  You notice blood in your vomit, bowel movement, or urine.  You have a fever. These symptoms may represent a serious problem that is an emergency. Do not wait to see if the symptoms will go away. Get medical help right away. Call your local emergency services (911 in the [place] Do not drive yourself to the hospital.   This information is not intended to replace advice given to you by your health care provider. Make sure you discuss any questions you have with your health care provider.   Document Released: 02/09/2000 Document Revised: 11/02/2014 Document Reviewed: 06/08/2014 Elsevier Interactive Patient Education [employer]2016 [employer] Inc[employer]

## 2015-02-15 NOTE — ED Notes (Signed)
Patient presents with c/o chest pressure that started yesterday. Patient reports that [place] and began to have LEFT sided CP. Denies N/V, SOB, and diaphoresis.

## 2015-02-15 NOTE — ED Provider Notes (Signed)
[facility] Emergency Department Provider Note  ____________________________________________  Time seen: 3:10 AM  I have reviewed the triage vital signs and the nursing notes.   HISTORY  Chief Complaint Chest Pain    HPI Jeremy Tate is a 34 y.o. male presents with left-sided chest pain described as pressure now currently 5 out of 10 since yesterday. Patient states that he drove from [place] to [place] and then back to [place] over the course of the last 24 hours. Patient denies any dyspnea no nausea vomiting or diaphoresis. Of note patient states that his mother has a clotting disorder that has resulted in multiple pulmonary emboli. Patient however has never had a DVT or PE.    Past medical history None There are no active problems to display for this patient.   Past Surgical History  Procedure Laterality Date  . Knee surgery      No current outpatient prescriptions on file.  Allergies No known drug allergies No family history on file.  Social History Social History  Substance Use Topics  . Smoking status: Current Every Day Smoker  . Smokeless tobacco: None  . Alcohol Use: No    Review of Systems  Constitutional: Negative for fever. Eyes: Negative for visual changes. ENT: Negative for sore throat. Cardiovascular: positive for chest pain. Respiratory: Negative for shortness of breath. Gastrointestinal: Negative for abdominal pain, vomiting and diarrhea. Genitourinary: Negative for dysuria. Musculoskeletal: Negative for back pain. Skin: Negative for rash. Neurological: Negative for headaches, focal weakness or numbness.   10-point ROS otherwise negative.  ____________________________________________   PHYSICAL EXAM:  VITAL SIGNS: ED Triage Vitals  Enc Vitals Group     BP 02/15/15 0232 129/72 mmHg     Pulse Rate 02/15/15 0232 64     Resp 02/15/15 0232 16     Temp 02/15/15 0232 98.6 F (37 C)     Temp Source 02/15/15 0232  Oral     SpO2 02/15/15 0232 97 %     Weight 02/15/15 0232 220 lb (99.791 kg)     Height 02/15/15 0232  (1.727 m)     Head Cir --      Peak Flow --      Pain Score 02/15/15 0232 8     Pain Loc --      Pain Edu? --      Excl. in GC? --      Constitutional: Alert and oriented. Well appearing and in no distress. Eyes: Conjunctivae are normal. PERRL. Normal extraocular movements. ENT   Head: Normocephalic and atraumatic.   Nose: No congestion/rhinnorhea.   Mouth/Throat: Mucous membranes are moist.   Neck: No stridor. Hematological/Lymphatic/Immunilogical: No cervical lymphadenopathy. Cardiovascular: Normal rate, regular rhythm. Normal and symmetric distal pulses are present in all extremities. No murmurs, rubs, or gallops. Respiratory: Normal respiratory effort without tachypnea nor retractions. Breath sounds are clear and equal bilaterally. No wheezes/rales/rhonchi. Gastrointestinal: Soft and nontender. No distention. There is no CVA tenderness. Genitourinary: deferred Musculoskeletal: Nontender with normal range of motion in all extremities. No joint effusions.  No lower extremity tenderness nor edema. Neurologic:  Normal speech and language. No gross focal neurologic deficits are appreciated. Speech is normal.  Skin:  Skin is warm, dry and intact. No rash noted. Psychiatric: Mood and affect are normal. Speech and behavior are normal. Patient exhibits appropriate insight and judgment.  ____________________________________________    LABS (pertinent positives/negatives)  Labs Reviewed  BASIC METABOLIC PANEL - Abnormal; Notable for the following:  Glucose, Bld 106 (*)    All other components within normal limits  CBC - Abnormal; Notable for the following:    WBC 11.1 (*)    All other components within normal limits  TROPONIN I  FIBRIN DERIVATIVES D-DIMER ([facility] ONLY)     ____________________________________________   EKG ED ECG REPORT I, Naketa Daddario,  Cro[place] N, the attending physician, personally viewed and interpreted this ECG.   Date: 02/15/2015  EKG Time: 2:57AM  Rate: 65  Rhythm: Normal Sinus rhythm  Axis: None  Intervals:normal  ST&T Change: none    ____________________________________________    RADIOLOGY CT Angio Chest PE W/Cm &/Or Wo Cm (Final result) Result time: 02/15/15 04:18:59   Final result by Rad Results In Interface (02/15/15 04:18:59)   Narrative:   CLINICAL DATA: 34 year old male with chest pressure and shortness of breath. Left-sided chest pain. History of clotting disorder.  EXAM: CT ANGIOGRAPHY CHEST WITH CONTRAST  TECHNIQUE: Multidetector CT imaging of the chest was performed using the standard protocol during bolus administration of intravenous contrast. Multiplanar CT image reconstructions and MIPs were obtained to evaluate the vascular anatomy.  CONTRAST: 100mL OMNIPAQUE IOHEXOL 350 MG/ML SOLN  COMPARISON: Chest radiograph dated 02/15/2015  FINDINGS: There is a cluster of nodularity in the left upper lobe most compatible with an infectious process possibly an atypical pneumonia. Correlation. with clinical exam and sputum cultures recommended. There is diffuse ground-glass opacities of the lungs with small areas of air trapping which may represent small vessel versus a small airway disease. There is no focal consolidation no pleural effusion or pneumothorax. The central airways are patent.  The thoracic aorta appears unremarkable. The right lower lobe subsegmental pulmonary artery branch embolus identified. There is no cardiomegaly or pericardial effusion. Top-normal left hilar lymph node. There is no mediastinal adenopathy. The visualized esophagus appears unremarkable.  There is no axillary adenopathy. The chest wall soft tissues appear unremarkable. The osseous structures are intact.  The visualized upper abdomen is grossly unremarkable.  Review of the MIP images confirms  the above findings.  IMPRESSION: Small right lower lobe subsegmental pulmonary artery branches embolus. No evidence of right cardiac strain.  Focal subpleural left upper lobe cluster of nodules concerning for pneumonia. Clinical correlation is recommended.  Critical Value/emergent results were called by telephone at the time of interpretation on 02/15/2015 at 4:16 am to Dr. Dolores FrameSung, who verbally acknowledged these results.   Electronically Signed By: Elgie CollardArash Radparvar M.D. On: 02/15/2015 04:18          DG Chest Port 1 View (Final result) Result time: 02/15/15 03:25:43   Final result by Rad Results In Interface (02/15/15 03:25:43)   Narrative:   CLINICAL DATA: 34 year old male with chest pain and pressure.  EXAM: PORTABLE CHEST 1 VIEW  COMPARISON: None.  FINDINGS: The heart size and mediastinal contours are within normal limits. Both lungs are clear. The visualized skeletal structures are unremarkable.  IMPRESSION: No active disease.   Electronically Signed By: Elgie CollardArash Radparvar M.D. On: 02/15/2015 03:25           Critical Care performed: CRITICAL CARE Performed by: Bayard MalesBROWN, [place] N   Total critical care time: 60 minutes  Critical care time was exclusive of separately billable procedures and treating other patients.  Critical care was necessary to treat or prevent imminent or life-threatening deterioration.  Critical care was time spent personally by me on the following activities: development of treatment plan with patient and/or surrogate as well as nursing, discussions with consultants, evaluation of patient's response to  treatment, examination of patient, obtaining history from patient or surrogate, ordering and performing treatments and interventions, ordering and review of laboratory studies, ordering and review of radiographic studies, pulse oximetry and re-evaluation of patient's  condition.  ____________________________________________   INITIAL IMPRESSION / ASSESSMENT AND PLAN / ED COURSE  Pertinent labs & imaging results that were available during my care of the patient were reviewed by me and considered in my medical decision making (see chart for details).  I informed the patient and CT scan findings of a pulmonary emboli as well as pneumonia. I told the patient that he needed to be in the hospital however he stated that he could not stay in the hospital. I informed the patient of the potential dangerous outcomes of his decision however he repeatedly that he cannot stay in hospital and that his plan is to return to [place] today. Given Patient's decision to leave against medical advice he will be prescribed Xarelto and Levaquin. Patient was advised to follow-up with a emergency department or primary care provider on arrival to [place]  ____________________________________________   FINAL CLINICAL IMPRESSION(S) / ED DIAGNOSES  Final diagnoses:  Pulmonary embolism on right [facility])  Community acquired pneumonia      Darci Current, MD 02/15/15 [phone]

## 2015-02-16 ENCOUNTER — Emergency Department: Admit: 2015-02-16

## 2015-02-16 ENCOUNTER — Inpatient Hospital Stay
Admit: 2015-02-16 | Discharge: 2015-02-16 | Disposition: A | Discharge status: Home / Self Care | Attending: Emergency Medicine

## 2015-02-16 DIAGNOSIS — R079 Chest pain, unspecified: Secondary | ICD-10-CM

## 2015-02-16 LAB — COMPREHENSIVE METABOLIC PANEL
ALT: 21 U/L (ref 0–40)
AST: 15 U/L (ref 0–39)
Albumin: 4.3 g/dL (ref 3.5–5.2)
Alkaline Phosphatase: 62 U/L (ref 40–129)
Anion Gap: 14 mmol/L (ref 7–16)
BUN: 11 mg/dL (ref 6–20)
CO2: 23 mmol/L (ref 22–29)
Calcium: 9.3 mg/dL (ref 8.6–10.2)
Chloride: 101 mmol/L (ref 98–107)
Creatinine: 1.1 mg/dL (ref 0.7–1.2)
GFR African American: >60
GFR Non-African American: >60 mL/min/{1.73_m2} (ref >=60)
Glucose: 94 mg/dL (ref 74–109)
Potassium: 3.8 mmol/L (ref 3.5–5.0)
Sodium: 138 mmol/L (ref 132–146)
Total Bilirubin: 0.3 mg/dL (ref 0.0–1.2)
Total Protein: 7.2 g/dL (ref 6.4–8.3)

## 2015-02-16 LAB — RESPIRATORY PANEL, MOLECULAR
Film Array Adenovirus: NOT DETECTED
Film Array Bordetella Pertusis: NOT DETECTED
Film Array Chlamydophilia Pneumoniae: NOT DETECTED
Film Array Conoravirus NL63: NOT DETECTED
Film Array Coronavirus 229E: NOT DETECTED
Film Array Coronavirus HKU1: NOT DETECTED
Film Array Coronavirus OC43: NOT DETECTED
Film Array Influenza A Virus 09H1: NOT DETECTED
Film Array Influenza A Virus H1: NOT DETECTED
Film Array Influenza A Virus H3: NOT DETECTED
Film Array Influenza A Virus: NOT DETECTED
Film Array Influenza B: NOT DETECTED
Film Array Metapneumovirus: NOT DETECTED
Film Array Mycoplasma Pneumoniae: NOT DETECTED
Film Array Parainfluenza Virus 1: NOT DETECTED
Film Array Parainfluenza Virus 2: NOT DETECTED
Film Array Parainfluenza Virus 3: NOT DETECTED
Film Array Parainfluenza Virus 4: NOT DETECTED
Film Array Respiratory Syncitial Virus: NOT DETECTED
Film Array Rhinovirus/Enterovirus: NOT DETECTED

## 2015-02-16 LAB — URINALYSIS
Bilirubin Urine: NEGATIVE
Glucose, Ur: NEGATIVE mg/dL
Ketones, Urine: NEGATIVE mg/dL
Leukocyte Esterase, Urine: NEGATIVE
Nitrite, Urine: NEGATIVE
Protein, UA: NEGATIVE mg/dL
Specific Gravity, UA: <=1.005 (ref 1.005–1.030)
Urobilinogen, Urine: 0.2 E.U./dL (ref <2.0)
pH, UA: 6 (ref 5.0–9.0)

## 2015-02-16 LAB — CBC WITH AUTO DIFFERENTIAL
Basophils %: 0.5 % (ref 0.0–2.0)
Basophils Absolute: 0.04 E9/L (ref 0.00–0.20)
Eosinophils %: 3.1 % (ref 0.0–6.0)
Eosinophils Absolute: 0.27 E9/L (ref 0.05–0.50)
Hematocrit: 42.9 % (ref 37.0–54.0)
Hemoglobin: 15.1 g/dL (ref 12.5–16.5)
Immature Granulocytes #: 0.01 E9/L
Immature Granulocytes %: 0.1 % (ref 0.0–5.0)
Lymphocytes %: 39.5 % (ref 20.0–42.0)
Lymphocytes Absolute: 3.44 E9/L (ref 1.50–4.00)
MCH: 30.9 pg (ref 26.0–35.0)
MCHC: 35.2 % — ABNORMAL HIGH (ref 32.0–34.5)
MCV: 87.9 fL (ref 80.0–99.9)
MPV: 9.3 fL (ref 7.0–12.0)
Monocytes %: 8.3 % (ref 2.0–12.0)
Monocytes Absolute: 0.72 E9/L (ref 0.10–0.95)
Neutrophils %: 48.5 % (ref 43.0–80.0)
Neutrophils Absolute: 4.22 E9/L (ref 1.80–7.30)
Platelets: 316 E9/L (ref 130–450)
RBC: 4.88 E12/L (ref 3.80–5.80)
RDW: 12.3 fL (ref 11.5–15.0)
WBC: 8.7 E9/L (ref 4.5–11.5)

## 2015-02-16 LAB — URINE DRUG SCREEN
Amphetamine Screen, Urine: NOT DETECTED (ref <1000)
Barbiturate Screen, Ur: NOT DETECTED (ref <200)
Benzodiazepine Screen, Urine: NOT DETECTED (ref <200)
Cannabinoid Scrn, Ur: POSITIVE — AB
Cocaine Metabolite Screen, Urine: NOT DETECTED (ref <300)
Methadone Screen, Urine: NOT DETECTED (ref <300)
Opiate Scrn, Ur: NOT DETECTED
PCP Screen, Urine: NOT DETECTED (ref <25)
Propoxyphene Scrn, Ur: NOT DETECTED (ref <300)

## 2015-02-16 LAB — MICROSCOPIC URINALYSIS

## 2015-02-16 LAB — LACTIC ACID: Lactic Acid: 1 mmol/L (ref 0.5–2.2)

## 2015-02-16 LAB — PROTIME-INR
INR: 1.2
Protime: 13.4 s — ABNORMAL HIGH (ref 9.3–12.4)

## 2015-02-16 LAB — TROPONIN: Troponin: <0.01 ng/mL (ref 0.00–0.03)

## 2015-02-16 LAB — APTT: aPTT: 33.2 s (ref 24.5–35.1)

## 2015-02-16 LAB — BRAIN NATRIURETIC PEPTIDE: Pro-BNP: 11 pg/mL (ref 0–125)

## 2015-02-16 MED ORDER — RIVAROXABAN 15 MG PO TABS
15 MG | ORAL_TABLET | Freq: Two times a day (BID) | ORAL | 0 refills | Status: DC
Start: 2015-02-16 — End: 2015-02-17

## 2015-02-16 MED ORDER — OXYCODONE-ACETAMINOPHEN 5-325 MG PO TABS
5-325 MG | ORAL_TABLET | ORAL | 0 refills | Status: DC
Start: 2015-02-16 — End: 2015-03-01

## 2015-02-16 MED ORDER — ONDANSETRON HCL 4 MG/2ML IJ SOLN
4 MG/2ML | Freq: Once | INTRAMUSCULAR | Status: AC
Start: 2015-02-16 — End: 2015-02-16
  Administered 2015-02-16: 09:00:00 4 mg via INTRAVENOUS

## 2015-02-16 MED ORDER — SODIUM CHLORIDE 0.9 % IV BOLUS
0.9 % | Freq: Once | INTRAVENOUS | Status: AC
Start: 2015-02-16 — End: 2015-02-16
  Administered 2015-02-16: 08:00:00 1000 mL via INTRAVENOUS

## 2015-02-16 MED ORDER — IOVERSOL 68 % IV SOLN
68 % | Freq: Once | INTRAVENOUS | Status: AC | PRN
Start: 2015-02-16 — End: 2015-02-16
  Administered 2015-02-16: 09:00:00 70 mL via INTRAVENOUS

## 2015-02-16 MED ORDER — MORPHINE SULFATE (PF) 4 MG/ML IV SOLN
4 MG/ML | Freq: Once | INTRAVENOUS | Status: AC
Start: 2015-02-16 — End: 2015-02-16
  Administered 2015-02-16: 09:00:00 4 mg via INTRAVENOUS

## 2015-02-16 MED FILL — ONDANSETRON HCL 4 MG/2ML IJ SOLN: 4 MG/2ML | INTRAMUSCULAR | Qty: 2

## 2015-02-16 MED FILL — MORPHINE SULFATE (PF) 4 MG/ML IV SOLN: 4 mg/mL | INTRAVENOUS | Qty: 1

## 2015-02-16 NOTE — ED Notes (Signed)
Reviewed discharge instructions. Patient verbalized understanding.      Tobyn Osgood Marko PlumeM Dattilo, RN  02/16/15 [phone]

## 2015-02-16 NOTE — ED Notes (Signed)
Warm blankets given per request.      Jodi Marble, RN  02/16/15 [phone]

## 2015-02-16 NOTE — ED Provider Notes (Signed)
ED Attending  ??  Department of Emergency Medicine   ED  Provider Note  Admit Date/Time: 02/16/2015  2:19 AM  ED Bed: 20/20   MRN: [id number]  Chief Complaint:   Shortness of Breath (diagnosed with right sided PE yesterday in [place], signed out [facility] and drove here, SOB now worse) and Chest Pain (worsening today, mid chest )       History of Present Illness   Source of history provided by:  patient.  History/Exam Limitations: none.       Walter Farmer is a 34 y.o. male who has a past medical history of: History reviewed. No pertinent past medical history. presents to the ED by private vehicle for chest pain, beginning a few days ago.  The complaint has been persistent, moderate in severity.  Patient states that he was diagnosed with a pulmonary embolism while in a [facility] hospital yesterday. He states that he wanted to come back home because he had family members with him, and wanted to be close by. States it is a pressure in his chest that is associated with a cough that is productive of sputum but not hemoptysis. He denies any associated fevers or chills. He denies palpitations, fluttering, near-syncope or any syncopal events. He denies pain in the abdomen, nausea vomiting, change in bowel pattern, blood in the stools or black tarry stools. Denies dysuria, frank hematuria, suprapubic or flank pains.    The patient brings records from [facility]. He was prescribed Xarelto  15 mg tablets and Levaquin 500 mg tablets for treatment of the pulmonary embolism and pneumonia likely. Patient also brings a copy of the CT angiogram of the chest report. The impression of the report is as follows: Small right lower lobe subsegmental pulmonary artery branches embolus. No evidence of right cardiac strain. Focal subpleural left upper lobe cluster of nodules concerning for pneumonia. Correlate clinically.    ROS   Pertinent positives and negatives are stated within HPI, all other systems reviewed  and are negative.       Past Surgical History   Procedure Laterality Date   ??? Tonsillectomy     ??? Knee surgery Right    Social History:  reports that he has been smoking.  He has been smoking about 0.50 packs per day. He does not have any smokeless tobacco history on file. He reports that he does not drink alcohol or use illicit drugs.  Family History: family history is not on file.   Allergies: Review of patient's allergies indicates no known allergies.    Physical Exam Section   Oxygen Saturation Interpretation: Normal.   ED Triage Vitals   BP Temp Temp Source Pulse Resp SpO2 Height Weight   02/16/15 0222 02/16/15 0222 02/16/15 0222 02/16/15 0222 02/16/15 0222 02/16/15 0222 02/16/15 0222 02/16/15 0222   115/81 98 ??F (36.7 ??C) Oral 61 14 98 %  (1.727 m) 230 lb (104.3 kg)       Physical Exam  ?? Constitutional/General: Alert and oriented x3, uncomfortable appearing, non toxic in NAD  ?? HEENT:  NC/NT. PERRLA,  Airway patent. Oral mucosa pink, moist, and intact without lesions.  ?? Neck: Supple, full ROM, non tender to palpation in the midline, no stridor, no crepitus, no JVD  ?? Respiratory: Mildly diminished air entry throughout. Lungs clear to auscultation bilaterally, no wheezes, rales, or rhonchi. Not in respiratory distress  ?? CV:  Regular rate. Regular rhythm. No murmurs, gallops, or rubs. 2+ distal  pulses  ?? Chest: No chest wall tenderness  ?? GI:  Abdomen Soft, round, obese, Non tender, Non distended.  +BS.   No rebound, guarding, or rigidity. No pulsatile masses.  ?? Musculoskeletal: Moves all extremities x 4. Warm and well perfused, no clubbing, cyanosis, or edema. Capillary refill <3 seconds  ?? Integument: skin warm and dry. No rashes.   ?? Lymphatic: no lymphadenopathy noted  ?? Neurologic: GCS 15, no focal deficits, symmetric strength 5/5 in the upper and lower extremities bilaterally  ?? Psychiatric: Normal Affect      Lab / Imaging Results   (All laboratory and radiology results have been personally  reviewed by myself)  Labs:  Results for orders placed or performed during the hospital encounter of 02/16/15   Troponin   Result Value Ref Range    Troponin <0.01 0.00 - 0.03 ng/mL   Brain Natriuretic Peptide   Result Value Ref Range    Pro-BNP 11 0 - 125 pg/mL   CBC auto differential   Result Value Ref Range    WBC 8.7 4.5 - 11.5 E9/L    RBC 4.88 3.80 - 5.80 E12/L    Hemoglobin 15.1 12.5 - 16.5 g/dL    Hematocrit 16.1 09.6 - 54.0 %    MCV 87.9 80.0 - 99.9 fL    MCH 30.9 26.0 - 35.0 pg    MCHC 35.2 (H) 32.0 - 34.5 %    RDW 12.3 11.5 - 15.0 fL    Platelets 316 130 - 450 E9/L    MPV 9.3 7.0 - 12.0 fL    Neutrophils Relative 48.5 43.0 - 80.0 %    Lymphocytes Relative 39.5 20.0 - 42.0 %    Monocytes Relative 8.3 2.0 - 12.0 %    Eosinophils Relative Percent 3.1 0.0 - 6.0 %    Basophils Relative 0.5 0.0 - 2.0 %    Neutrophils Absolute 4.22 1.80 - 7.30 E9/L    Lymphocytes Absolute 3.44 1.50 - 4.00 E9/L    Monocytes Absolute 0.72 0.10 - 0.95 E9/L    Eosinophils Absolute 0.27 0.05 - 0.50 E9/L    Basophils Absolute 0.04 0.00 - 0.20 E9/L    Immature Granulocytes % 0.1 0.0 - 5.0 %    Immature Granulocytes # 0.01 E9/L   Comprehensive metabolic panel   Result Value Ref Range    Sodium 138 132 - 146 mmol/L    Potassium 3.8 3.5 - 5.0 mmol/L    Chloride 101 98 - 107 mmol/L    CO2 23 22 - 29 mmol/L    Anion Gap 14 7 - 16 mmol/L    Glucose 94 74 - 109 mg/dL    BUN 11 6 - 20 mg/dL    CREATININE 1.1 0.7 - 1.2 mg/dL    GFR Non-African American >60 >=60 mL/min/1.73    GFR African American >60     Calcium 9.3 8.6 - 10.2 mg/dL    Total Protein 7.2 6.4 - 8.3 g/dL    Alb 4.3 3.5 - 5.2 g/dL    Total Bilirubin 0.3 0.0 - 1.2 mg/dL    Alkaline Phosphatase 62 40 - 129 U/L    ALT 21 0 - 40 U/L    AST 15 0 - 39 U/L   Lactic acid   Result Value Ref Range    Lactic Acid 1.0 0.5 - 2.2 mmol/L   APTT   Result Value Ref Range    aPTT 33.2 24.5 - 35.1 sec   Protime-INR  Result Value Ref Range    Protime 13.4 (H) 9.3 - 12.4 sec    INR 1.2    Urinalysis    Result Value Ref Range    Color, UA Yellow Straw/Yellow    Clarity, UA Clear Clear    Glucose, Ur Negative Negative mg/dL    Bilirubin Urine Negative Negative    Ketones, Urine Negative Negative mg/dL    Specific Gravity, UA <=1.005 1.005 - 1.030    Blood, Urine SMALL (A) Negative    pH, UA 6.0 5.0 - 9.0    Protein, UA Negative Negative mg/dL    Urobilinogen, Urine 0.2 <2.0 E.U./dL    Nitrite, Urine Negative Negative    Leukocyte Esterase, Urine Negative Negative   Urine Drug Screen   Result Value Ref Range    Amphetamine Screen, Urine NOT DETECTED Negative <1000 ng/mL    Barbiturate Screen, Ur NOT DETECTED Negative < 200 ng/mL    Benzodiazepine Screen, Urine NOT DETECTED Negative < 200 ng/mL    Cannabinoid Scrn, Ur POSITIVE (A) Negative < 50ng/mL    COCAINE METABOLITE SCREEN URINE NOT DETECTED Negative < 300 ng/mL    Opiate Scrn, Ur NOT DETECTED Negative < 300ng/mL    PCP Scrn, Ur NOT DETECTED Negative < 25 ng/mL    Methadone Screen, Urine NOT DETECTED Negative <300 ng/mL    Propoxyphene Scrn, Ur NOT DETECTED Negative <300 ng/mL   Microscopic Urinalysis   Result Value Ref Range    WBC, UA NONE 0 - 5 /HPF    RBC, UA 1-3 0 - 2 /HPF    Bacteria, UA RARE (A) /HPF     Imaging:  All Radiology results interpreted by Radiologist unless otherwise noted.  CTA Chest W WO Contrast   Final Result      Acute-appearing, isolated pulmonary embolism within the distal segmental branch    of the right lower lobe medial basal segment (series 4, image 93).  No evidence    of acute right heart strain or pulmonary arterial hypertension.      THIS REPORT CONTAINS FINDINGS THAT MAY BE CRITICAL TO PATIENT CARE. The    findings were verbally communicated via telephone conference with Dr. Rexanne Manoonley at    5:12 AM EST on 02/16/2015. The findings were acknowledged and understood.      This Final report was electronically signed by Elwanda BrooklynVerhey, Peter, MD on 16 Feb 2015 5:12 AM EDT.        EKG:   Interpreted by myself with attending physician unless  otherwise noted.  Time:  02:28:56    Interpretation:   Rhythm: NSR   Rate: 63   Intervals: normal   Axis: normal   ST/T waves: T wave flattening in the inferior leads III and aVF. There is a Q wave noted in III that is isolated and not shared with any concurrent leads. There is no sign of any acute ST or T-wave advised to suggest infarct or ischemia.   R wave progression: normal  Comparison: stable as compared to patient's most recent EKG.      ED Course / Medical Decision Making     Medications   0.9 % sodium chloride bolus (1,000 mLs Intravenous New Bag 02/16/15 0325)   morphine (PF) injection 4 mg (4 mg Intravenous Given 02/16/15 0335)   ondansetron (ZOFRAN) injection 4 mg (4 mg Intravenous Given 02/16/15 0333)   ioversol (OPTIRAY) 68 % injection 70 mL (70 mLs Intravenous Given 02/16/15 0409)     ED Course  Re-Evaluations:  02/16/15      Time: 4:20am    Patient???s symptoms are improving. Awaiting CTA Chest  Time: 5:10am  Patient seen and reexamined the bedside by myself and Dr. Rexanne Mano. Results are explained to patient in plain language, treatment plan reviewed, patient is agreeable with plan for discharge at this point. Understands that will need to call to arrange follow-up.    Consultations:             None    Procedures:   none    MDM:  32 -year-old male presenting to the ER for evaluation of chest pain after recently being diagnosed with pneumonia and a small subsegmental pulmonary embolism during an ER visit in a [facility]. Patient drove back to the area to be closer to family. His exam is relatively benign. The chest pain is on the ipsilateral side and sounds pleuritic. He has a history of 2 weeks with cough and production. No near syncopal episodes, syncope, palpitations, fatigue, activity limitations, orthopnea, edema, paroxysmal nocturnal dyspnea. Patient is well-appearing and nontoxic. His troponin is normal. ProBNP is normal. There is no signs of right heart strain on CT. EKG is  unremarkable. Patient is given Xarelto twice daily for 21 days for the acute phase of treatment. Percocet for pain is advised to use caution for potential drowsiness. Continue the Levaquin as prescribed by the [facility]. Follow-up with PCP and pulmonology within 72 hours. ER if needed in the interim or symptoms worsen.    Counseling:   I have spoken with the patient and discussed today???s results, in addition to providing specific details for the plan of care and counseling regarding the diagnosis and prognosis and are agreeable with the plan.     Assessment      1. Chest pain, unspecified type    2. Other acute pulmonary embolism without acute cor pulmonale (HCC)    3. CAP (community acquired pneumonia)    4. Cannabis abuse      This patient's ED course included: a personal history and physicial examination, re-evaluation prior to disposition, multiple bedside re-evaluations, IV medications, cardiac monitoring, continuous pulse oximetry and complex medical decision making and emergency management  This patient has remained hemodynamically stable and improved during their ED course.   Plan   Discharge to home.  Patient condition is good.    New Medications     New Prescriptions    OXYCODONE-ACETAMINOPHEN (PERCOCET) 5-325 MG PER TABLET    Take by oral route 1 tablets every 6 hours as needed for moderate to severe pain. Use caution his may cause drowsiness or sedation. Do not drive or drink alcohol while taking..     Electronically signed by Elberta Fortis, NP   DD: 02/16/15  **This report was transcribed using voice recognition software. Every effort was made to ensure accuracy; however, inadvertent computerized transcription errors may be present.  END OF PROVIDER NOTE        Elberta Fortis, NP  02/16/15 [phone]

## 2015-02-17 ENCOUNTER — Inpatient Hospital Stay
Admit: 2015-02-17 | Discharge: 2015-02-17 | Disposition: A | Discharge status: Home / Self Care | Attending: Emergency Medicine

## 2015-02-17 DIAGNOSIS — I2699 Other pulmonary embolism without acute cor pulmonale: Secondary | ICD-10-CM

## 2015-02-17 MED ORDER — RIVAROXABAN 15 MG PO TABS
15 MG | ORAL_TABLET | Freq: Two times a day (BID) | ORAL | 0 refills | Status: DC
Start: 2015-02-17 — End: 2015-04-11

## 2015-02-17 MED ORDER — ENOXAPARIN SODIUM 100 MG/ML SC SOLN
100 MG/ML | Freq: Once | SUBCUTANEOUS | Status: AC
Start: 2015-02-17 — End: 2015-02-17
  Administered 2015-02-17: 15:00:00 160 mg/kg via SUBCUTANEOUS

## 2015-02-17 MED ORDER — RIVAROXABAN 20 MG PO TABS
20 MG | ORAL_TABLET | Freq: Every day | ORAL | 0 refills | Status: DC
Start: 2015-02-17 — End: 2015-03-01

## 2015-02-17 MED ORDER — RIVAROXABAN 20 MG PO TABS
20 MG | ORAL_TABLET | Freq: Every day | ORAL | 0 refills | Status: DC
Start: 2015-02-17 — End: 2015-02-17

## 2015-02-17 MED FILL — LOVENOX 100 MG/ML SC SOLN: 100 MG/ML | SUBCUTANEOUS | Qty: 2

## 2015-02-17 NOTE — Care Coordination-Inpatient (Addendum)
CM CONTACTED [facility] AND WAS TOLD TO GIVE PATIENT THE DVT/PE STARTER PACK TO TAKE TO HIS PHARMACY TO HAVE [facility] PRESCRIPTION FILLED. PATIENT WILL ALSO BE GIVEN THE [facility] CARE PATH SAVINGS CARD THAT HE CAN USE TOWARDS A CO-PAY ONCE HIS INSURANCE IS RE-INSTATED.   CM ALSO INSTRUCTED PATIENT TO GO TO [facility] OFFICE TO BE SCREEN FOR ELIGIBILITY FOR HELP W/MEDS  Penny Arrambide, RN

## 2015-02-17 NOTE — ED Notes (Signed)
Rocky LinkKen PA at bedside.     Bethanie DickerStaci M Medved, RN  02/17/15 1059

## 2015-02-17 NOTE — ED Provider Notes (Signed)
ED Attending    ??  Department of Emergency Medicine   ED  Provider Note  Admit Date/Time: 02/17/2015  9:02 AM  ED Bed: 04/04   MRN: [id number]  Chief Complaint:   Chest Pain (SEEN 2 DAYS AGO, HX PE IN RIGHT LUNG CHEST PAIN HAS GOTTEN WORSE SINCE. HAS NOT BEEN ABLE TO GET BLOODTHINNER SCRIPT FILLED D/T FINANCIAL ALSO C/O DIZZINESS. )       History of Present Illness   Source of history provided by:  patient.  History/Exam Limitations: none.       Walter Farmer is a 34 y.o. male who has a past medical history of:   Past Medical History   Diagnosis Date   ??? PE (pulmonary thromboembolism) ([place])     presents to the ED via private car for reevaluation after he was evaluated late Tuesday night into Wednesday morning for a pulmonary emboli.  Patient indicates he was diagnosed in [place] several days earlier with a blood clot with the presence of pneumonia.  He was given a prescription for atbx which he did not get filled due to cost, he signed out of the hospital against advice and drove home.  here he had a repeat CT scan indicating a right distal segmental pulmonary emboli to the right lower lobe without any evidence of pneumonia.  All of his vital signs and lab work were normal.  Patient was given a his 1st dose of Xarelto in the emergency department and a prescription to go home with.  He realized once he arrived at the pharmacy he does not have any insurance coverage and was unable to pay for his prescription.  He awakened today slightly dizzy and became concerned without having today's dose of blood thinners in a way to get them filled so he returned to the hospital for reevaluation.  There've been no other developing symptoms since his previous visit.    ROS   Pertinent positives and negatives are stated within HPI, all other systems reviewed and are negative.       Past Surgical History   Procedure Laterality Date   ??? Tonsillectomy     ??? Knee surgery Right    Social History:  reports that he has been smoking.   He has been smoking about 0.50 packs per day. He does not have any smokeless tobacco history on file. He reports that he does not drink alcohol or use illicit drugs.  Family History: family history is not on file.   Allergies: Review of patient's allergies indicates no known allergies.    Physical Exam Section   Oxygen Saturation Interpretation: Normal.   ED Triage Vitals   BP Temp Temp src Pulse Resp SpO2 Height Weight   02/17/15 0856 02/17/15 0856 -- 02/17/15 0856 02/17/15 0856 02/17/15 0856 02/17/15 0856 02/17/15 0856   128/74 97.6 ??F (36.4 ??C)  68 18 99 % _0  (1.727 m) 230 lb (104.3 kg)       Physical Exam  ?? Constitutional/General: Alert and oriented x3, well appearing, non toxic in NAD  ?? HEENT:  NC/NT. PERRLA,  Airway patent.  ?? Neck: Supple, full ROM, non tender to palpation in the midline, no stridor, no crepitus, no meningeal signs  ?? Respiratory: Lungs clear to auscultation bilaterally, no wheezes, rales, or rhonchi. Not in respiratory distress  ?? CV:  Regular rate. Regular rhythm. No murmurs, gallops, or rubs. 2+ distal pulses  ?? Chest: No chest wall tenderness  ?? GI:  Abdomen  Soft, Non tender, Non distended.  +BS.   No rebound, guarding, or rigidity. No pulsatile masses.  ?? Musculoskeletal: Moves all extremities x 4. Warm and well perfused, no clubbing, cyanosis, or edema. Capillary refill <3 seconds  ?? Integument: skin warm and dry. No rashes.   ?? Lymphatic: no lymphadenopathy noted  ?? Neurologic: GCS 15, no focal deficits, symmetric strength 5/5 in the upper and lower extremities bilaterally  ?? Psychiatric: Normal Affect      Lab / Imaging Results   (All laboratory and radiology results have been personally reviewed by myself)  Labs:  Results for orders placed or performed during the hospital encounter of 02/17/15   EKG 12 Lead   Result Value Ref Range    Ventricular Rate 67 BPM    Atrial Rate 67 BPM    P-R Interval 134 ms    QRS Duration 90 ms    Q-T Interval 370 ms    QTc Calculation (Bazett) 390  ms    P Axis 59 degrees    R Axis 43 degrees    T Axis 42 degrees     Imaging:  All Radiology results interpreted by Radiologist unless otherwise noted.  No orders to display     EKG #1:  Interpreted by emergency department physician unless otherwise noted.  Time:  0900    Rate: 67  Rhythm: Sinus.  Interpretation: NSR.  Comparison: stable as compared to patient's most recent EKG.    ED Course / Medical Decision Making     Medications   enoxaparin (LOVENOX) injection 160 mg (160 mg Subcutaneous Given 02/17/15 0946)     ED Course     Re-Evaluations:  02/17/15      Time: 1110a    Patient???s symptoms remained stable.  No complaints of chest pains or dyspnea/dizziness..    Consultations:             None    Procedures:   none    MDM:  The emergency department case manager has arranged for patient to be evaluated by [facility] representative today following his emergency department visit.  He was given an injection of Lovenox along with a prescription for 30 day supply of Xarelto with free starter kit voucher along with a discount Card for Any Additional Medicines Needed.  Patient Will Be Referred to Medicine Clinic for Additional Evaluation and Treatment until He Is Able to Establish with a Primary Care Doctor in the Area Once His Insurance Becomes Reactivated..     Counseling:   I have spoken with the patient and discussed today???s results, in addition to providing specific details for the plan of care and counseling regarding the diagnosis and prognosis and are agreeable with the plan.     Assessment      1. Other acute pulmonary embolism without acute cor pulmonale ([place])      This patient's ED course included: a personal history and physicial examination and re-evaluation prior to disposition  This patient has remained hemodynamically stable and been closely monitored during their ED course.   Plan   Discharge to home.  Patient condition is good.    New Medications     New Prescriptions    RIVAROXABAN (XARELTO) 15 MG TABS  TABLET    Take 1 tablet by mouth 2 times daily (with meals) for 21 days    RIVAROXABAN (XARELTO) 20 MG TABS TABLET    Take 1 tablet by mouth daily (with breakfast) for 10 days Begin this treatment  after 3 weeks of twice daily regimen completed.     Electronically signed by Wilber Oliphant, PA   DD: 02/17/15  **This report was transcribed using voice recognition software. Every effort was made to ensure accuracy; however, inadvertent computerized transcription errors may be present.  END OF PROVIDER NOTE       Elias Else, PA  02/17/15 [place], [place]  02/17/15 1153

## 2015-02-18 LAB — CULTURE, URINE

## 2015-02-20 LAB — CANNABINOID, URINE, CONFIRMATION: Cannabinoids Conf Ur: 83 ng/mL

## 2015-02-21 LAB — CULTURE BLOOD #1: Blood Culture, Routine: 5

## 2015-02-21 LAB — CULTURE, BLOOD 2: Culture, Blood 2: 5

## 2015-02-28 LAB — EKG 12-LEAD
Atrial Rate: 63 {beats}/min
P Axis: 40 degrees
P-R Interval: 142 ms
Q-T Interval: 388 ms
QRS Duration: 92 ms
QTc Calculation (Bazett): 397 ms
R Axis: 18 degrees
T Axis: 29 degrees
Ventricular Rate: 63 {beats}/min

## 2015-03-01 ENCOUNTER — Ambulatory Visit: Admit: 2015-03-01 | Discharge: 2015-03-01 | Payer: MEDICAID | Attending: Adult Health

## 2015-03-01 DIAGNOSIS — I2699 Other pulmonary embolism without acute cor pulmonale: Secondary | ICD-10-CM

## 2015-03-01 MED ORDER — RANITIDINE HCL 150 MG PO TABS
150 MG | ORAL_TABLET | Freq: Two times a day (BID) | ORAL | 3 refills | Status: DC
Start: 2015-03-01 — End: 2019-03-24

## 2015-03-01 NOTE — Progress Notes (Signed)
OFFICE PROGRESS NOTE  [facility]  [facility]  [address]  [place] [id number]  Dept: [phone]  Loc: [phone]   Chief Complaint   Patient presents with   ??? Establish Care   ??? Follow-Up from Hospital     Pt states he went into hospital for what felt like an anxiety attack while living [place] - they told him about having blood clots in lungs. Pt signed out AMA and came back to [place]. [facility] since.          HPI:     Here today to establish care and follow up from ER for Pulmonary Emboli he is currently on Xarelto 15 mg twice a day and has the card for free medication.     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 He is accompanied by his mom and she states 2 weeks before he had swelling in the left leg he states he had no other symptoms just swelling from the knee down. He developed what he thought was an anxiety attack was having CP and went to the ER   The [facility] [place]. He was prescribed Xarelto  15 mg tablets and Levaquin 500 mg tablets for treatment of the pulmonary embolism and pneumonia likely. Patient also brings a copy of the CT angiogram of the chest report. The impression of the report is as follows: Small right lower lobe subsegmental pulmonary artery branches embolus. No evidence of right cardiac strain. Focal subpleural left upper lobe cluster of nodules concerning for pneumonia. Correlate clinically.     He unfortunately didn't get the Xarelto due to the cost and insurance lapse, [facility] and again 02/17/15     EXAM:   CT Angiography Chest With Intravenous Contrast.   ??   CLINICAL HISTORY:   35 years old, male; Pain; Chest pain; Additional info: Recently diagnosed with    pe and [place], evaluate for interval worsening   ??   TECHNIQUE:   Axial computed tomographic  angiography images of the chest with intravenous    contrast using pulmonary embolism protocol. ??This CT exam was performed using    one or more of the following dose reduction techniques: ??automated exposure    control, adjustment of the mA and/or kV according to patient size, and/or use    of iterative reconstruction technique.   MIP reconstructed images were created and reviewed.   Coronal and sagittal reformatted images were created and reviewed.   ??   CONTRAST:   70 mL of 320 opti administered intravenously.   ??   COMPARISON:   No relevant prior studies available.   ??   FINDINGS:   Pulmonary arteries: ??Acute-appearing, isolated pulmonary embolism within the    distal segmental branch of the right lower lobe medial basal segment (series 4,    image 93).   Aorta: ??No acute findings.   Lungs: ??Normal.   Pleural spaces: ??Normal.   Heart: ??Normal.   Bones: ??Normal.   Lymph nodes: ??Normal.   ??   ??   Impression   ??   Acute-appearing, isolated pulmonary embolism within the distal segmental branch    of the right lower lobe medial basal segment (series 4, image 93). ??No evidence  of acute right heart strain or pulmonary arterial hypertension.   ??   THIS REPORT CONTAINS FINDINGS THAT MAY BE CRITICAL TO PATIENT CARE. The    findings were verbally communicated via telephone conference with Dr. Rexanne Mano at    5:12 AM EST on 02/16/2015. The findings were acknowledged and understood.   ??   This Final report was electronically signed by Elwanda [place], MD on 16 Feb 2015 5:12 AM EDT.   ??       GERD: Paitent complains of heartburn. This has been associated with belching, heartburn, need to clear throat frequently and waterbrash.  He denies abdominal bloating, belching and eructation, bilious reflux, chest pain, choking on food, cough, deep pressure at base of neck, difficulty swallowing, dysphagia, early satiety, fullness after meals, hematemesis, hoarseness, laryngitis, melena, midespigastric pain, nausea, nocturnal burning,  odynophagia, regurgitation of undigested food, shortness of breath, symptoms primarily relate to meals, and lying down after meals, unexpected weight loss, upper abdominal discomfort and wheezing. Symptoms have been present for several years. He denies dysphagia.  He has not lost weight. He denies melena, hematochezia, hematemesis, and coffee ground emesis. Medical therapy in the past has included none.          Mom has history of Factor V has had TIA, HTN, hyperlipidemia, obesity,    Current Outpatient Prescriptions:   ???  ranitidine (ZANTAC) 150 MG tablet, Take 1 tablet by mouth 2 times daily, Disp: 60 tablet, Rfl: 3  ???  rivaroxaban (XARELTO) 15 MG TABS tablet, Take 1 tablet by mouth 2 times daily (with meals) for 21 days, Disp: 42 tablet, Rfl: 0  Social History     Social History   ??? Marital status: Single     Spouse name: N/A   ??? Number of children: N/A   ??? Years of education: N/A     Social History Main Topics   ??? Smoking status: Heavy Tobacco Smoker     Packs/day: 0.50   ??? Smokeless tobacco: Current User     Types: Chew   ??? Alcohol use No   ??? Drug use: No   ??? Sexual activity: Not Asked     Other Topics Concern   ??? None     Social History Narrative       I have reviewed Walter Farmer's allergies, medications, problem list, medical, social and family history and have updated as needed in the electronic medical record???    Review of Systems   Constitutional: Negative for activity change, appetite change, chills, diaphoresis, fatigue, fever and unexpected weight change.   HENT: Negative for nosebleeds, postnasal drip, rhinorrhea, sinus pressure, sneezing and sore throat.    Eyes: Negative for visual disturbance.   Respiratory: Positive for cough. Negative for choking, chest tightness, shortness of breath and wheezing.         Dry cough   Cardiovascular: Negative for chest pain, palpitations and leg swelling.   Gastrointestinal: Positive for constipation. Negative for abdominal pain, diarrhea, nausea and vomiting.         Heartburn, globus sensation, dry mouth   Endocrine: Negative for cold intolerance, heat intolerance, polydipsia, polyphagia and polyuria.   Genitourinary: Negative for difficulty urinating, dysuria, hematuria and urgency.   Musculoskeletal: Negative for arthralgias, back pain, gait problem, joint swelling, myalgias, neck pain and neck stiffness.   Skin: Negative for color change, pallor and rash.   Allergic/Immunologic: Negative for environmental allergies, food allergies and immunocompromised state.   Neurological: Positive for headaches. Negative for dizziness, tremors,  seizures, syncope, facial asymmetry, speech difficulty, weakness, light-headedness and numbness.        Headache since starting the xarelto   Hematological: Negative for adenopathy. Does not bruise/bleed easily.   Psychiatric/Behavioral: Negative for agitation, behavioral problems, confusion, decreased concentration, dysphoric mood, hallucinations, self-injury, sleep disturbance and suicidal ideas. The patient is not nervous/anxious and is not hyperactive.         Hx anxiety attacks last year 2015         OBJECTIVE:     VS:  Wt Readings from Last 3 Encounters:   03/01/15 221 lb (100.2 kg)   02/17/15 230 lb (104.3 kg)   02/16/15 230 lb (104.3 kg)                       Vitals:    03/01/15 1124   BP: 112/86   Site: Left Arm   Position: Sitting   Cuff Size: Medium Adult   Pulse: 61   Resp: 20   Temp: 98 ??F (36.7 ??C)   TempSrc: Oral   SpO2: 98%   Weight: 221 lb (100.2 kg)   Height: 5\' 8"  (1.727 m)       General: Alert and oriented to person, place, and time, well developed and well nourished, in no acute distress  SKIN: Warm and dry, intact without any rash, masses or lesions  HEAD: normocephalic, atraumatic  Eyes: sclera/conjunctiva clear, PERRLA, EOMI's intact  ENT: tympanic membranes, external ear and ear canal normal bilaterally, normal hearing, Nose without deformity, nasal mucosa and turbinates normal without polyps   Throat: clear, tongue midline  coated, tonsils 2+, no drainage, no masses or lesions noted, good dentition  Neck: supple and non-tender without mass, trachea midline, no cervical lymphadenopathy, no bruit, no thyromegaly or nodules  Cardiovascular: regular rate and regular rhythm, normal S1 and S2, no murmurs, rubs, clicks, or gallop. Distal pulses intact, no carotid bruits. No edema  Pulmonary/Chest: clear to auscultation bilaterally, no wheezes, rales or rhonchi, normal air movement, no respiratory distress  Abdomen: soft, non-tender, non-distended, normal bowel sounds, no masses or organomegaly  Musculoskeletal: Normal ROM, no joint swelling, deformity or tenderness   Neurologic: reflexes normal and symmetric, no cranial nerve deficit, gait, coordination and speech normal  Extremities: no clubbing, cyanosis, or edema.   Psychiatric: Good eye contact, normal mood and affect, answers questions appropriately    ASSESSMENT/PLAN   Walter Farmer was seen today for establish care and follow-up from hospital.    Diagnoses and all orders for this visit:    Other acute pulmonary embolism without acute cor pulmonale [facility])  -     [facility] - Bufford Buttner, MD    FHx: factor V Leiden mutation  -     [facility] - Thelma Barge, Newman Nip, MD    Gastroesophageal reflux disease without esophagitis  -     ranitidine (ZANTAC) 150 MG tablet; Take 1 tablet by mouth 2 times daily      [place] this [place]  Discussed the importance of follow up and taking medication every day as can be deadly, will refer to hematology for further evaluation of possible factor V Leiden mutation since his mom has it. Recommend he also see GI due to the length of time he has had the symptoms of GERD discussed Barrett's esophagus and to cut down on the smoking and caffeine.      Fasting labs next visit  Take  the zantac twice a day for 2 weeks then daily  RTO 1 month    Return in about 1 month (around 04/01/2015) for fasting labs PE.      Discussed reducing caffeine intake, Discussed smoking cessation, Discussed weight loss, Discussed exercising 30 minutes daily and Discussed taking medications as directed and adverse effects        I have reviewed my findings and recommendations with Stancil A Gellatly.    Athena Masse, NP-C, FNP-BC

## 2015-03-01 NOTE — Patient Instructions (Addendum)
Gastroesophageal Reflux Disease (GERD): Care Instructions  Your Care Instructions     Gastroesophageal reflux disease (GERD) is the backward flow of stomach acid into the esophagus. The esophagus is the tube that leads from your throat to your stomach. A one-way valve prevents the stomach acid from moving up into this tube. When you have GERD, this valve does not close tightly enough.  If you have mild GERD symptoms including heartburn, you may be able to control the problem with antacids or over-the-counter medicine. Changing your diet, losing weight, and making other lifestyle changes can also help reduce symptoms.  Follow-up care is a key part of your treatment and safety. Be sure to make and go to all appointments, and call your doctor if you are having problems. It???s also a good idea to know your test results and keep a list of the medicines you take.  How can you care for yourself at home?  ?? Take your medicines exactly as prescribed. Call your doctor if you think you are having a problem with your medicine.  ?? Your doctor may recommend over-the-counter medicine. For mild or occasional indigestion, antacids, such as Tums, Gaviscon, Mylanta, or Maalox, may help. Your doctor also may recommend over-the-counter acid reducers, such as Pepcid AC, Tagamet HB, Zantac 75, or Prilosec. Read and follow all instructions on the label. If you use these medicines often, talk with your doctor.  ?? Change your eating habits.  ?? It???s best to eat several small meals instead of two or three large meals.  ?? After you eat, wait 2 to 3 hours before you lie down.  ?? Chocolate, mint, and alcohol can make GERD worse.  ?? Spicy foods, foods that have a lot of acid (like tomatoes and oranges), and coffee can make GERD symptoms worse in some people. If your symptoms are worse after you eat a certain food, you may want to stop eating that food to see if your symptoms get better.  ?? Do not smoke or chew tobacco. Smoking can make GERD  worse. If you need help quitting, talk to your doctor about stop-smoking programs and medicines. These can increase your chances of quitting for good.  ?? If you have GERD symptoms at night, raise the head of your bed 6 to 8 inches by putting the frame on blocks or placing a foam wedge under the head of your mattress. (Adding extra pillows does not work.)  ?? Do not wear tight clothing around your middle.  ?? Lose weight if you need to. Losing just 5 to 10 pounds can help.  When should you call for help?  Call your doctor now or seek immediate medical care if:  ?? You have new or different belly pain.  ?? Your stools are black and tarlike or have streaks of blood.  Watch closely for changes in your health, and be sure to contact your doctor if:  ?? Your symptoms have not improved after 2 days.  ?? Food seems to catch in your throat or chest.  Where can you learn more?  Go to [email or web address] and sign in to your MyChart account. Enter T927 in the Search Health Information box to learn more about ???Gastroesophageal Reflux Disease (GERD): Care Instructions.???    If you do not have an account, please click on the ???Sign Up Now??? link.  ?? 2006-2016 [employer]. Care instructions adapted under license by  Health. This care instruction is for use with your licensed healthcare professional. If you have   questions about a medical condition or this instruction, always ask your healthcare professional. [employer], Inc[employer] disclaims any warranty or liability for your use of this information.  Content Version: 11.0.578772; Current as of: January 14, 2014        Pulmonary Embolism: Care Instructions  Your Care Instructions  Pulmonary embolism is the sudden blockage of an artery in the lung. Blood clots in the deep veins of the leg or pelvis (deep vein thrombosis, or DVT) are the most common cause. These blood clots can travel to the lungs.  Pulmonary embolism can be very serious. Because you  have had one pulmonary embolism, you are at greater risk for having another one. But you can take steps to prevent another pulmonary embolism by following your doctor's instructions.  You will probably take a prescription blood-thinning medicine to prevent blood clots. A blood thinner can stop a blood clot from growing larger and prevent new clots from forming.  Follow-up care is a key part of your treatment and safety. Be sure to make and go to all appointments, and call your doctor if you are having problems. It's also a good idea to know your test results and keep a list of the medicines you take.  How can you care for yourself at home?  ?? Take your medicines exactly as prescribed. Call your doctor if you think you are having a problem with your medicine. You will get more details on the specific medicines your doctor prescribes.  ?? If you are taking a blood thinner, be sure you get instructions about how to take your medicine safely. Blood thinners can cause serious bleeding problems.  Preventing future pulmonary embolisms  ?? Exercise. Keep blood moving in your legs to keep clots from forming. If you are traveling by car, stop every hour or so. Get out and walk around for a few minutes. If you are traveling by bus, train, or plane, get out of your seat and walk up and down the aisles every hour or so. You also can do leg exercises while you are seated. Pump your feet up and down by pulling your toes up toward your knees then pointing them down.  ?? Get up out of bed as soon as possible after an illness or surgery.  ?? Do not smoke. If you need help quitting, talk to your doctor about stop-smoking programs and medicines. These can increase your chances of quitting for good.  ?? Check with your doctor before taking hormone or birth control pills. These may increase your risk of blot clots.  ?? Ask your doctor about wearing compression stockings to help prevent blood clots in your legs. You can buy these with a  prescription at medical supply stores and some drugstores.  When should you call for help?  Call 911 anytime you think you may need emergency care. For example, call if:  ?? You have shortness of breath.  ?? You have chest pain.  ?? You passed out (lost consciousness).  ?? You cough up blood.  Call your doctor now or seek immediate medical care if:  ?? You have new or worsening pain or swelling in your leg.  Watch closely for changes in your health, and be sure to contact your doctor if:  ?? You do not get better as expected.  Where can you learn more?  Go to [email or web address] and sign in to your MyChart account. Enter [id number] in the Search Health Information box to learn more about ???Pulmonary Embolism: Care  Instructions.???    If you do not have an account, please click on the ???Sign Up Now??? link.  ?? 2006-2016 [employer], [employer]. Care instructions adapted under license by [facility]. This care instruction is for use with your licensed healthcare professional. If you have questions about a medical condition or this instruction, always ask your healthcare professional. [employer], [employer] disclaims any warranty or liability for your use of this information.  Content Version: 11.0.578772; Current as of: July 30, 2014      Fasting labs next visit  Take the zantac twice a day for 2 weeks then daily  RTO 1 month    Learning About Benefits From Quitting Smoking  How does quitting smoking make you healthier?  If you're thinking about quitting smoking, you may have a few reasons to be smoke-free. Your health may be one of them.  ?? When you quit smoking, you lower your risks for cancer, lung disease, heart attack, stroke, blood vessel disease, and blindness from macular degeneration.  ?? When you're smoke-free, you get sick less often, and you heal faster. You are less likely to get colds, flu, bronchitis, and pneumonia.  ?? As a nonsmoker, you may find that your mood is better and you are less  stressed.  When and how will you feel healthier?  Quitting has real health benefits that start from day 1 of being smoke-free. And the longer you stay smoke-free, the healthier you get and the better you feel.  The first hours  ?? After just 20 minutes, your blood pressure and heart rate go down. That means there's less stress on your heart and blood vessels.  ?? Within 12 hours, the level of carbon monoxide in your blood drops back to normal. That makes room for more oxygen. With more oxygen in your body, you may notice that you have more energy than when you smoked.  After 2 weeks  ?? Your lungs start to work better.  ?? Your risk of heart attack starts to drop.  After 1 month  ?? When your lungs are clear, you cough less and breathe deeper, so it's easier to be active.  ?? Your sense of taste and smell return. That means you can enjoy food more than you have since you started smoking.  Over the years  ?? After 1 year, your risk of heart disease is half what it would be if you kept smoking.  ?? After 5 years, your risk of stroke starts to shrink. Within a few years after that, it's about the same as if you'd never smoked.  ?? After 10 years, your risk of dying from lung cancer is cut by about half. And your risk for many other types of cancer is lower too.  How would quitting help others in your life?  When you quit smoking, you improve the health of everyone who now breathes in your smoke.  ?? Their heart, lung, and cancer risks drop, much like yours.  ?? They are sick less. For babies and small children, living smoke-free means they're less likely to have ear infections, pneumonia, and bronchitis.  ?? If you're a woman who is or will be pregnant someday, quitting smoking means a healthier newborn.  ?? Children who are close to you are less likely to become adult smokers.  Where can you learn more?  Go to [email or web address] and sign in to your MyChart account. Enter O319 in the Search Health Information  box to learn more about ???Learning About Benefits  From Quitting Smoking.???    If you do not have an account, please click on the ???Sign Up Now??? link.  ?? 2006-2016 [employer], [employer]. Care instructions adapted under license by [facility]. This care instruction is for use with your licensed healthcare professional. If you have questions about a medical condition or this instruction, always ask your healthcare professional. [employer], [employer] disclaims any warranty or liability for your use of this information.  Content Version: 11.0.578772; Current as of: Jul 21, 2014          Deciding About Using Medicines To Quit Smoking  What are the medicines you can use?  Your doctor may prescribe varenicline (Chantix) or bupropion (Zyban). These medicines can help you cope with cravings for tobacco. They are pills that don't contain nicotine.  You also can use nicotine replacement products. These do contain nicotine. There are many types.  ?? Gum and lozenges slowly release nicotine into your mouth.  ?? Patches stick to your skin. They slowly release nicotine into your bloodstream.  ?? An inhaler has a holder that contains nicotine. You breathe in a puff of nicotine vapor through your mouth and throat.  ?? Nasal spray releases a mist that contains nicotine.  What are key points about this decision?  ?? Using medicines can double your chances of quitting smoking. They can ease cravings and withdrawal symptoms.  ?? Getting counseling along with using medicine can raise your chances of quitting even more.  ?? If you smoke fewer than 5 cigarettes a day, you may not need medicines to help you quit smoking.  ?? These medicines have less nicotine than cigarettes. And by itself, nicotine is not nearly as harmful as smoking. The tars, carbon monoxide, and other toxic chemicals in tobacco cause the harmful effects.  ?? The side effects of nicotine replacement products depend on the type of product. For example, a patch can make your  skin red and itchy. Medicines in pill form can make you sick to your stomach. They can also cause dry mouth and trouble sleeping. For most people, the side effects are not bad enough to make them stop using the products.  ?? FDA warning. The U.S. Food and Drug Administration (FDA) warns that people who are taking bupropion or varenicline and who have any serious or unusual changes in mood or behavior or who feel like hurting themselves or someone else should stop taking the medicine and call a doctor right away. If you already have a mood or behavior problem, be sure to tell your doctor before you decide to use these medicines.  Why might you choose to use medicines to quit smoking?  ?? You have tried on your own to stop smoking, but you were not able to stop.  ?? You smoke more than 5 cigarettes a day.  ?? You want to increase your chances of quitting smoking.  ?? You want to reduce your cravings and withdrawal symptoms.  ?? You feel the benefits of medicine outweigh the side effects.  Why might you choose not to use medicine?  ?? You want to try quitting on your own by stopping all at once ("cold [place]key").  ?? You want to cut back slowly on the number of cigarettes you smoke.  ?? You smoke fewer than 5 cigarettes a day.  ?? You do not like using medicine.  ?? You feel the side effects of medicines outweigh the benefits.  ?? You are worried about the cost of medicines.  Your  decision  Thinking about the facts and your feelings can help you make a decision that is right for you. Be sure you understand the benefits and risks of your options, and think about what else you need to do before you make the decision.  Where can you learn more?  Go to [email or web address] and sign in to your MyChart account. Enter [id number] in the Search Health Information box to learn more about ???Deciding About Using Medicines To Quit Smoking.???    If you do not have an account, please click on the ???Sign Up Now??? link.  ?? 2006-2016  [employer], [employer]. Care instructions adapted under license by [facility]. This care instruction is for use with your licensed healthcare professional. If you have questions about a medical condition or this instruction, always ask your healthcare professional. [employer], Inc[employer] disclaims any warranty or liability for your use of this information.  Content Version: 11.0.578772; Current as of: Jul 21, 2014

## 2015-03-08 LAB — EKG 12-LEAD
Atrial Rate: 67 {beats}/min
P Axis: 59 degrees
P-R Interval: 134 ms
Q-T Interval: 370 ms
QRS Duration: 90 ms
QTc Calculation (Bazett): 390 ms
R Axis: 43 degrees
T Axis: 42 degrees
Ventricular Rate: 67 {beats}/min

## 2015-03-10 ENCOUNTER — Encounter: Attending: Medical Oncology

## 2015-03-14 ENCOUNTER — Ambulatory Visit: Admit: 2015-03-14 | Discharge: 2015-03-14 | Attending: Medical Oncology

## 2015-03-14 ENCOUNTER — Inpatient Hospital Stay: Attending: Medical Oncology

## 2015-03-14 DIAGNOSIS — I2699 Other pulmonary embolism without acute cor pulmonale: Secondary | ICD-10-CM

## 2015-03-14 LAB — CBC WITH AUTO DIFFERENTIAL
Basophils %: 0.4 % (ref 0.0–2.0)
Basophils Absolute: 0.04 E9/L (ref 0.00–0.20)
Eosinophils %: 3.3 % (ref 0.0–6.0)
Eosinophils Absolute: 0.31 E9/L (ref 0.05–0.50)
Hematocrit: 45 % (ref 37.0–54.0)
Hemoglobin: 15.1 g/dL (ref 12.5–16.5)
Immature Granulocytes #: 0.02 E9/L
Immature Granulocytes %: 0.2 % (ref 0.0–5.0)
Lymphocytes %: 42 % (ref 20.0–42.0)
Lymphocytes Absolute: 3.99 E9/L (ref 1.50–4.00)
MCH: 29.9 pg (ref 26.0–35.0)
MCHC: 33.6 % (ref 32.0–34.5)
MCV: 89.1 fL (ref 80.0–99.9)
MPV: 9.5 fL (ref 7.0–12.0)
Monocytes %: 7.7 % (ref 2.0–12.0)
Monocytes Absolute: 0.73 E9/L (ref 0.10–0.95)
Neutrophils %: 46.4 % (ref 43.0–80.0)
Neutrophils Absolute: 4.4 E9/L (ref 1.80–7.30)
Platelets: 310 E9/L (ref 130–450)
RBC: 5.05 E12/L (ref 3.80–5.80)
RDW: 12.5 fL (ref 11.5–15.0)
WBC: 9.5 E9/L (ref 4.5–11.5)

## 2015-03-14 LAB — COMPREHENSIVE METABOLIC PANEL
ALT: 18 U/L (ref 0–40)
AST: 17 U/L (ref 0–39)
Albumin: 4.5 g/dL (ref 3.5–5.2)
Alkaline Phosphatase: 73 U/L (ref 40–129)
Anion Gap: 11 mmol/L (ref 7–16)
BUN: 9 mg/dL (ref 6–20)
CO2: 25 mmol/L (ref 22–29)
Calcium: 9.2 mg/dL (ref 8.6–10.2)
Chloride: 102 mmol/L (ref 98–107)
Creatinine: 1 mg/dL (ref 0.7–1.2)
GFR African American: >60
GFR Non-African American: >60 mL/min/{1.73_m2} (ref >=60)
Glucose: 89 mg/dL (ref 74–109)
Potassium: 4 mmol/L (ref 3.5–5.0)
Sodium: 138 mmol/L (ref 132–146)
Total Bilirubin: 0.2 mg/dL (ref 0.0–1.2)
Total Protein: 7.4 g/dL (ref 6.4–8.3)

## 2015-03-14 LAB — PROTIME-INR
INR: 1
Protime: 11.2 s (ref 9.3–12.4)

## 2015-03-14 LAB — D-DIMER, QUANTITATIVE: D-Dimer, Quant: <200 ng/mL DDU

## 2015-03-14 NOTE — Progress Notes (Signed)
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 FHx of Factor V Leiden mutation  He was in [place] in December 2016, where he reports being diagnosed with a right side pulmonary emboli.  That occurred on 02/15/2015 where he signed himself out of the hospital and drove back to [facility] ER. CTA of the chest on 02/16/2015 revealed Acute-appearing, isolated pulmonary embolism within the distal segmental branch of the right lower lobe medial basal segment. No evidence of acute right heart strain or pulmonary arterial hypertension. He was started on Xarelto 15 mg po bid and was referred to our clinic for further evaluation and treatment.    Review of Systems;  CONSTITUTIONAL: No fever, chills. Good appetite and energy level.   ENMT: Eyes: No diplopia; Nose: No epistaxis. Mouth: No sore throat.  RESPIRATORY: No hemoptysis, shortness of breath, cough.   CARDIOVASCULAR: No chest pain, palpitations.  GASTROINTESTINAL: No nausea/vomiting, abdominal pain, diarrhea/constipation.  GENITOURINARY: No dysuria, urinary frequency, hematuria.  NEURO: No syncope, presyncope, headache.  Remainder:  ROS NEGATIVE    Past Medical History:      Diagnosis Date   . PE (pulmonary thromboembolism) (HCC)      Past Surgical History:      Procedure Laterality Date   . Tonsillectomy     . Knee surgery Right      Family History:  Family History   Problem Relation Age of Onset   . High Blood Pressure Mother    . High Cholesterol Mother    . Stroke Mother    . Obesity Mother    . Other Mother      factor V full  expression, thyroid disease, rheumatic fever   . Diabetes Father      Medications:  Reviewed and reconciled.    Social History:  Social History     Social History   . Marital  status: Single     Spouse name: N/A   . Number of children: N/A   . Years of education: N/A     Occupational History   . Not on file.     Social History Main Topics   . Smoking status: Heavy Tobacco Smoker     Packs/day: 0.50   . Smokeless tobacco: Current User     Types: Chew   . Alcohol use No   . Drug use: No   . Sexual activity: Not on file     Other Topics Concern   . Not on file     Social History Narrative     Allergies:  No Known Allergies    Physical Exam:  Visit Vitals   . BP 119/72 (Site: Left Arm, Position: Sitting, Cuff Size: Large Adult)   . Pulse 83   . Temp 98.3 F (36.8 C) (Temporal)   . Resp 20   . Ht  (1.727 m)   . Wt 219 lb 1.6 oz (99.4 kg)   . BMI 33.31 kg/m2     GENERAL: Alert, oriented x 3, not in acute distress.  HEENT: PERRLA; EOMI. Oropharynx clear.   NECK: Supple. Without lymphadenopathy.   LUNGS: Good air entry bilaterally. No wheezing,  crackles or ronchi.   CARDIOVASCULAR: Regular rate. No murmurs, rubs or gallops.   ABDOMEN: Soft. Non-tender, non-distended. Positive bowel sounds.  EXTREMITIES: Without clubbing, cyanosis, or edema.   NEUROLOGIC: No focal deficits.     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 No evidence of acute right heart strain or pulmonary arterial hypertension. He was started on Xarelto 15 mg po bid which he is tolerating well.  Labs drawn today, including thrombophilia panel and APS work-up. Continue Xarelto in the interim.  RTC in 4 weeks to review test results and duration of anticoagulation. He is still smoking; Smoking cessation was recommended.    Thank you for allowing [place] to participate in the care of Walter Farmer.    Walter Muff, MD   03/14/2015  Board Certified Medical Oncologist   [facility]   [facility] MEDICAL ONCOLOGY   [address] [place], [place] [address]

## 2015-03-14 NOTE — Progress Notes (Signed)
Labs drawn peripherally and dry dressing applied. Specimen was sent to lab.

## 2015-03-14 NOTE — Progress Notes (Signed)
Patient reports minor tightness in his chest.

## 2015-03-14 NOTE — Progress Notes (Signed)
Walter Farmer  03/31/1980 35 y.o.      Referring Physician:     Tami Lin, CNP :    Vitals:    03/14/15 1431   BP: 119/72   Pulse: 83   Resp: 20   Temp: 98.3 ??F (36.8 ??C)    :              Chief Complaint:   Chief Complaint   Patient presents with   ??? Consultation     "I had blood clots in my lung"       No matching staging information was found for the patient.    Prior Radiation Therapy? no   If yes, site treated:   Facility:                             Date:    Concurrent Chemo/radiation? no   If yes, start date:    Prior Chemotherapy? no   If yes, site treated:   Facility:                             Date:    Prior Hormonal Therapy? no   If yes, site treated:   Facility:                             Date:    LMP: na    Age at first Menses: na    Gravida: na    Para: na              Current Outpatient Prescriptions:   ???  rivaroxaban (XARELTO) 15 MG TABS tablet, Take 15 mg by mouth 2 times daily, Disp: , Rfl:   ???  acetaminophen (TYLENOL) 500 MG tablet, Take 1,000 mg by mouth every 6 hours as needed for Pain, Disp: , Rfl:   ???  ranitidine (ZANTAC) 150 MG tablet, Take 1 tablet by mouth 2 times daily, Disp: 60 tablet, Rfl: 3  ???  rivaroxaban (XARELTO) 15 MG TABS tablet, Take 1 tablet by mouth 2 times daily (with meals) for 21 days, Disp: 42 tablet, Rfl: 0       Past Medical History   Diagnosis Date   ??? GERD (gastroesophageal reflux disease)    ??? Neuropathy ([place])      right shouolder to fingers-due to a pinched nerve   ??? PE (pulmonary thromboembolism) ([place])        Past Surgical History   Procedure Laterality Date   ??? Tonsillectomy     ??? Knee surgery Right        Family History   Problem Relation Age of Onset   ??? High Blood Pressure Mother    ??? High Cholesterol Mother    ??? Stroke Mother    ??? Obesity Mother    ??? Other Mother      factor V full  expression, thyroid disease, rheumatic fever   ??? COPD Mother    ??? Diabetes Father    ??? Scoliosis Sister    ??? Other Brother      MVA   ??? Mult Sclerosis Paternal Grandmother    ???  No Known Problems Paternal Grandfather        Social History     Social History   ??? Marital status: Single     Spouse name: N/A   ???  Number of children: N/A   ??? Years of education: N/A     Occupational History   ??? Not on file.     Social History Main Topics   ??? Smoking status: Heavy Tobacco Smoker     Packs/day: 1.00     Years: 10.00   ??? Smokeless tobacco: Current User     Types: Chew   ??? Alcohol use No      Comment: very rare   ??? Drug use: Yes      Comment: quit 3-4 months ago for his job   ??? Sexual activity: Yes     Other Topics Concern   ??? Not on file     Social History Narrative           Occupation: truck driver  Retired:  no  If Retired, from where:no        [place]:    Pacemaker/Defibulator/ICD:  no    Mediport: no             FALLS RISK SCREEN  Instructions:  Assess the patient and circle the appropriate indicators that are present for fall risk identification.   Total the numbers circled and assign a fall risk score from Table 2.  Reassess patient at a minimum every 12 weeks or with status change.    Assessment   Date  03/14/2015   1.  Mental Ability: confusion/cognitively impaired 0   2.  Elimination Issues: incontinence, frequency 0   3.  Ambulatory: use of assistive devices (walker, cane, off-loading devices), attached to equipment (IV pole, oxygen) 0   4.  Sensory Limitations: dizziness, vertigo, impaired vision 0   5.  Age less than 65          0   6.  Age 47 or greater 0   7.  Medication: diuretics, strong analgesics, hypnotics, sedatives, antihypertensive agents 0   8.  Falls:  recent history of falls within the last 3 months (not to include slipping or tripping) 0   TOTAL 0  If score of 4 or greater was education given? no           TABLE 2   Risk Score Risk Level Plan of Care   0-3 Little or  No Risk 1.  Provide assistance as indicated for ambulation activities  2.  Reorient confused/cognitively impaired patient  3.  Call-light/bell within patient's reach  4.  Chair/bed in low position,  stretcher/bed with siderails up except when performing patient care activities  5.  Educate patient/family/caregiver on falls prevention  6.  Reassess in 12 weeks or with any noted change in patient condition which places them at a risk for a fall   4-6 Moderate Risk 1.  Provide assistance as indicated for ambulation activities  2.  Reorient confused/cognitively impaired patient  3.  Call-light/bell within patient's reach  4.  Chair/bed in low position, stretcher/bed with siderails up except when performing patient care activities  5.  Educate patient/family/caregiver on falls prevention  6.  Falls risk precaution (Yellow sticker Level II) placed on patient chart   7 or   Higher High Risk 1.  Place patient in easily observable treatment room  2.  Patient attended at all times by family member or staff  3.  Provide assistance as indicated for ambulation activities  4.  Reorient confused/cognitively impaired patient  5.  Call-light/bell within patient's reach  6.  Chair/bed in low position, stretcher/bed with siderails up except when performing  patient care activities  7.  Educate patient/family/caregiver on falls prevention  8.  Falls risk precaution (Yellow sticker Level III) placed on patient chart         NUTRITION RISK SCREEN  Instructions:  Assess the patient and enter the appropriate indicators that are present for nutrition risk identification. Total the numbers entered and assign a risk score. Follow the steps below according to the total score.     Assessment   Date  03/14/2015     1. Poor appetite????a. One week or greater (1)                       b. One month or greater (2) 0       2. Unplanned wt loss????a. Greater than 5# in 1 week(1)                                  b. Greater than 10# in 1 month (2)                                  c. Greater than 20# in 6 mos (1)   0     3. Diagnosis of Head or Neck Cancer? (2)   0   4.  If yes, difficulty swallowing? (1)   0     5.  Receiving nutrition via feeding tube  or parenteral nutrition? (1)     0   6.  If yes, report of problems with feeding tube? (1) 0     TOTAL   0        Score of 0-1: No action  Score 2 or greater:  1. For Non-Diabetic Patient: Recommend adding Ensure Complete 2xdaily and provide              patient with Ensure wellness bag with coupons;  For Diabetic Patient, Recommend adding Glucerna Shake 2xdaily and provide patient with Glucerna Wellness bag with coupons  Route to the dietitian via Epic            Distress Screening Assessment   1. Completed by the patient? yes  2. Reviewed by RN? yes  3. Triggers met for immediate intervention (score of 4 or more)? Scored 4 for distress over past week, but just broke up with girlfriend, work slow and nervous about today's appointment.   If Yes: a. What intervention provided: na    b. Referral made to Social Services? no              Marella Chimes

## 2015-03-15 LAB — DILUTE RUSSELL VENOM VIPER TIME: Lupus Anticoag DVVT: NEGATIVE

## 2015-03-15 MED ORDER — RIVAROXABAN 15 MG PO TABS
15 MG | ORAL_TABLET | Freq: Every day | ORAL | 0 refills | Status: DC
Start: 2015-03-15 — End: 2015-04-11

## 2015-03-15 NOTE — Telephone Encounter (Signed)
Pt would like script sent to [facility] on [address].

## 2015-03-17 LAB — BETA-2 GLYCOPROTEIN ANTIBODIES
Beta-2 Glyco 1 IgG: 0 SGU (ref 0–20)
Beta-2 Glyco 1 IgM: 6 SMU (ref 0–20)

## 2015-03-17 LAB — CARDIOLIPIN AB IGG, IGM, IGA
Anticardiolipin IgA: 1 [APL'U] (ref 0–11)
Anticardiolipin IgG: 5 [GPL'U] (ref 0–14)
Cardiolipin Ab IgM: 6 [MPL'U] (ref 0–12)

## 2015-03-17 LAB — PROTEIN S ANTIGEN, FREE: Protein S Ag, Free: 106 % (ref 74–147)

## 2015-03-18 LAB — PROTEIN C FUNCTIONAL: Protein C-Functional: 149 % (ref 83–168)

## 2015-03-21 ENCOUNTER — Encounter

## 2015-03-21 LAB — MISCELLANEOUS SENDOUT

## 2015-03-21 NOTE — Telephone Encounter (Signed)
I called patient asking him to return to the office for lab work, informing him that two of the labs ordered were not processed and that doctor wanted to add on two more labs. Patient reported that [place] and is not sure when he will return, I requested that he call once he returns so that we can schedule for a lab visit. He expressed understanding.

## 2015-03-23 ENCOUNTER — Inpatient Hospital Stay: Attending: Medical Oncology

## 2015-03-23 DIAGNOSIS — Z832 Family history of diseases of the blood and blood-forming organs and certain disorders involving the immune mechanism: Secondary | ICD-10-CM

## 2015-03-23 LAB — ANTITHROMBIN III: AT-III Activity: 114 % Activity (ref 83–121)

## 2015-03-23 LAB — HOMOCYSTEINE: Homocysteine: 11.9 umol/L (ref 0.0–15.0)

## 2015-03-24 ENCOUNTER — Inpatient Hospital Stay: Attending: Medical Oncology

## 2015-03-24 LAB — APTT: aPTT: 44.1 s — ABNORMAL HIGH (ref 24.5–35.1)

## 2015-03-24 NOTE — Progress Notes (Signed)
Labs drawn peripherally.  Dry dressing applied.

## 2015-03-30 LAB — THROMBOPHILIA PANEL

## 2015-04-03 ENCOUNTER — Encounter: Payer: MEDICAID | Attending: Adult Health

## 2015-04-04 NOTE — Telephone Encounter (Signed)
Pt did call back and was rescheduled.

## 2015-04-04 NOTE — Telephone Encounter (Signed)
No show on 2/6 for fasting labs and PE with Cheri. Called pt, advised him of the missed appointment and asked him if he wanted to r/s, sounded as if pt hung up.

## 2015-04-11 ENCOUNTER — Inpatient Hospital Stay: Attending: Medical Oncology

## 2015-04-11 ENCOUNTER — Ambulatory Visit: Admit: 2015-04-11 | Discharge: 2015-04-11 | Payer: MEDICAID | Attending: Medical Oncology

## 2015-04-11 DIAGNOSIS — I2699 Other pulmonary embolism without acute cor pulmonale: Secondary | ICD-10-CM

## 2015-04-11 MED ORDER — RIVAROXABAN 20 MG PO TABS
20 MG | ORAL_TABLET | Freq: Every day | ORAL | 2 refills | Status: DC
Start: 2015-04-11 — End: 2019-03-24

## 2015-04-11 NOTE — Progress Notes (Signed)
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 FHx of Factor V Leiden mutation  He was in [place] in December 2016, where he reports being diagnosed with a right side pulmonary emboli.  That occurred on 02/15/2015 where he signed himself out of the hospital and drove back to [facility] ER. CTA of the chest on 02/16/2015 revealed Acute-appearing, isolated pulmonary embolism within the distal segmental branch of the right lower lobe medial basal segment. No evidence of acute right heart strain or pulmonary arterial hypertension. He was started on Xarelto and was referred to our clinic for further evaluation and treatment.    Review of Systems;  CONSTITUTIONAL: No fever, chills. Good appetite and energy level.   ENMT: Eyes: No diplopia; Nose: No epistaxis. Mouth: No sore throat.  RESPIRATORY: No hemoptysis, shortness of breath, cough.   CARDIOVASCULAR: No chest pain, palpitations.  GASTROINTESTINAL: No nausea/vomiting, abdominal pain, diarrhea/constipation.  GENITOURINARY: No dysuria, urinary frequency, hematuria.  NEURO: No syncope, presyncope, headache.  Remainder:  ROS NEGATIVE    Past Medical History:      Diagnosis Date   . GERD (gastroesophageal reflux disease)    . Neuropathy ([facility])      right shouolder to fingers-due to a pinched nerve   . PE (pulmonary thromboembolism) ([facility])      Medications:  Reviewed and reconciled.    Allergies:  No Known Allergies    Physical Exam:  Visit Vitals   . BP 127/80 (Site: Right Arm, Position: Sitting, Cuff Size: Medium Adult)   . Pulse 74   . Temp 98.2 F (36.8 C) (Temporal)   . Ht  (1.727 m)   . Wt 219 lb 11.2 oz (99.7 kg)   . SpO2 98%   . BMI 33.41 kg/m2     GENERAL: Alert, oriented x 3, not in acute distress.  HEENT: PERRLA; EOMI. Oropharynx  clear.   NECK: Supple. Without lymphadenopathy.   LUNGS: Good air entry bilaterally. No wheezing, crackles or ronchi.   CARDIOVASCULAR: Regular rate. No murmurs, rubs or gallops.   ABDOMEN: Soft. Non-tender, non-distended. Positive bowel sounds.  EXTREMITIES: Without clubbing, cyanosis, or edema.   NEUROLOGIC: No focal deficits.     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 No evidence of acute right heart strain or pulmonary arterial hypertension. He is currently on Xarelto.  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  RTC 3 months. He is still smoking; Smoking cessation was recommended.    Charmian Muff, MD   04/11/2015  Board Certified Medical Oncologist   [facility]   [facility] MEDICAL ONCOLOGY   [address] [place], [place] [address]

## 2015-04-11 NOTE — Progress Notes (Signed)
Patient stated he is doing well.

## 2015-04-12 ENCOUNTER — Encounter: Payer: MEDICAID | Attending: Adult Health

## 2015-04-13 NOTE — Telephone Encounter (Signed)
2nd no show on 2/15 for fasting labs/PE with Cheri. Voicemail not set up yet, unable to leave msg.

## 2015-07-11 ENCOUNTER — Encounter: Payer: MEDICAID | Attending: Medical Oncology

## 2015-07-11 ENCOUNTER — Encounter

## 2017-07-21 IMAGING — CT CT ANGIO CHEST
1 of 2 series · 18 of 30 positions shown · IV contrast (APPLIED)
Comparison: Chest radiograph dated 02/15/2015

CLINICAL DATA: 34-year-old male with chest pressure and shortness
of breath. Left-sided chest pain. History of clotting disorder.

EXAM:
CT ANGIOGRAPHY CHEST WITH CONTRAST
TECHNIQUE: Multidetector CT imaging of the chest was performed using the
standard protocol during bolus administration of intravenous
contrast. Multiplanar CT image reconstructions and MIPs were
obtained to evaluate the vascular anatomy.
CONTRAST:  100mL OMNIPAQUE IOHEXOL 350 MG/ML SOLN

[Series 5: pe 1.0 thins · axial · 0.76mm/px · z∈[-298,-78]mm · 18 of 249 slices shown]
[im 14/249  lung]
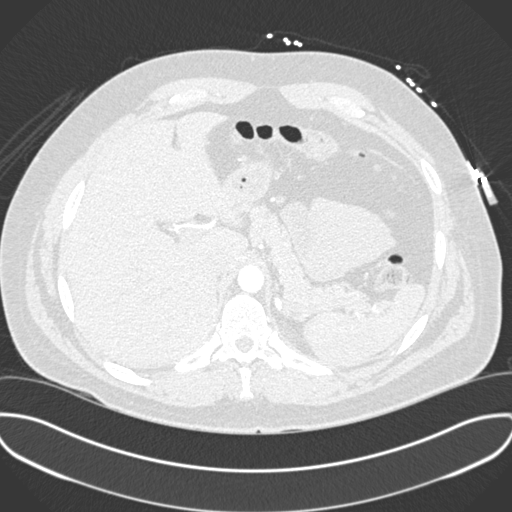
[im 28/249  mediastinal]
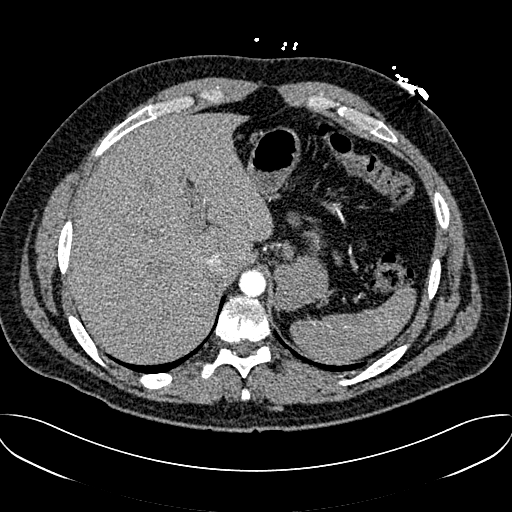
[im 42/249  lung]
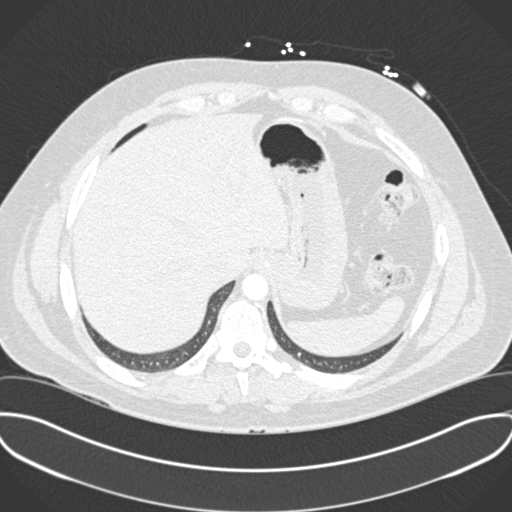
[im 56/249  mediastinal]
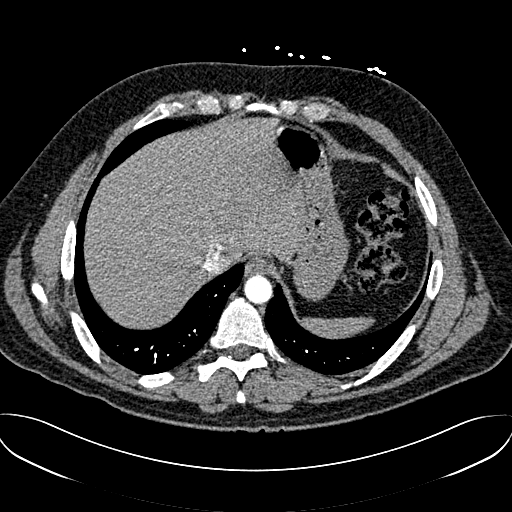
[im 69/249  lung]
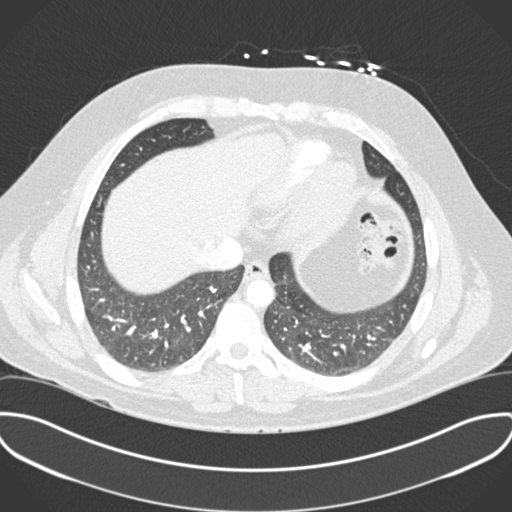
[im 83/249  mediastinal]
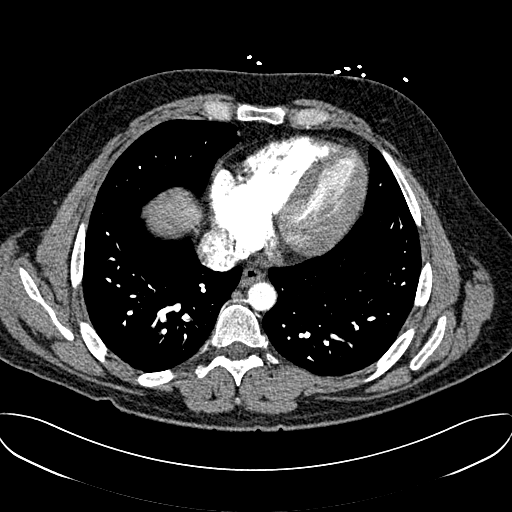
[im 97/249  lung]
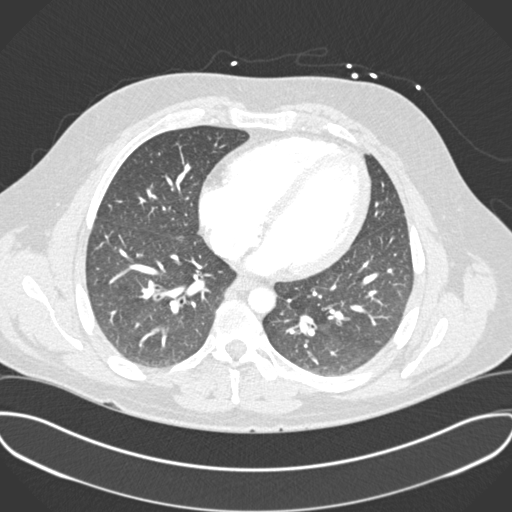
[im 111/249  mediastinal]
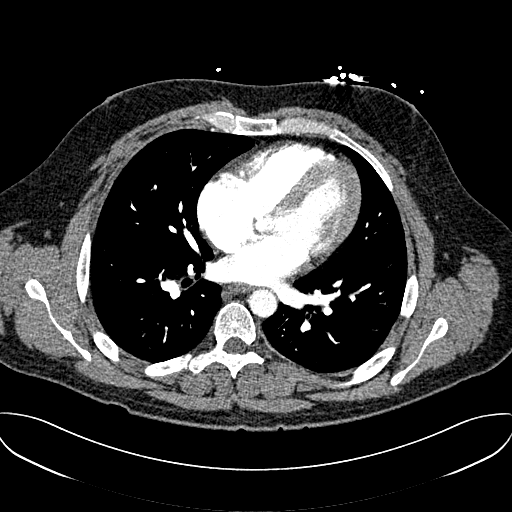
[im 115/249  lung]
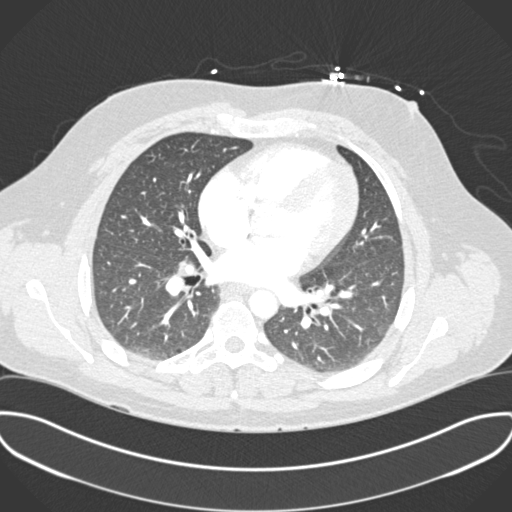
[im 125/249  mediastinal]
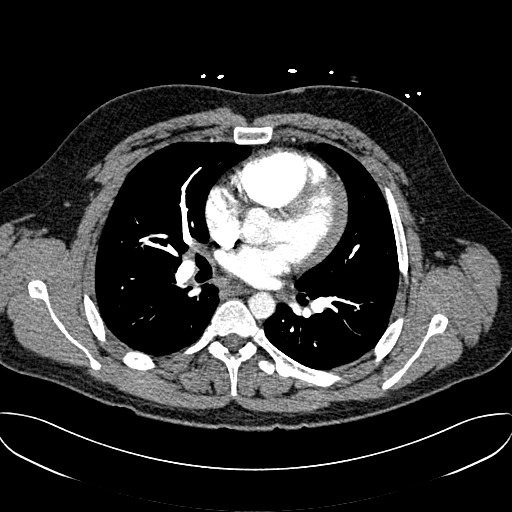
[im 138/249  lung]
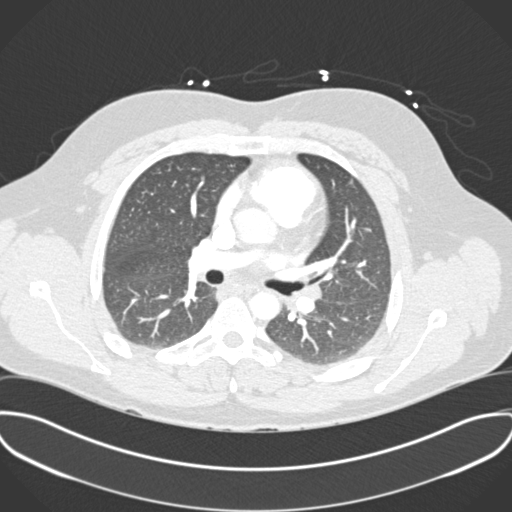
[im 152/249  mediastinal]
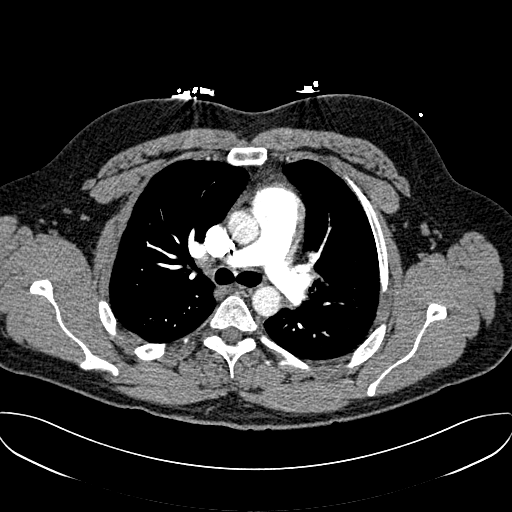
[im 166/249  lung]
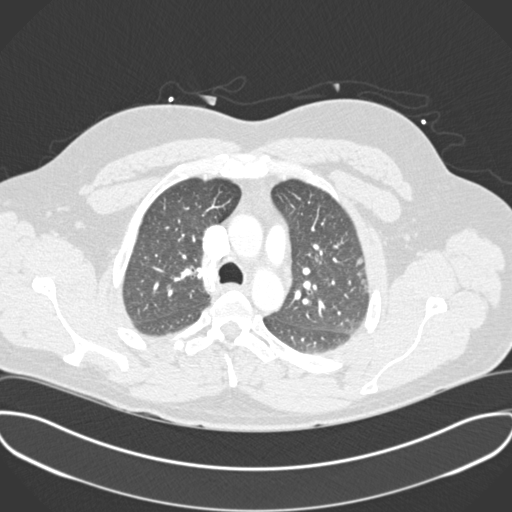
[im 180/249  mediastinal]
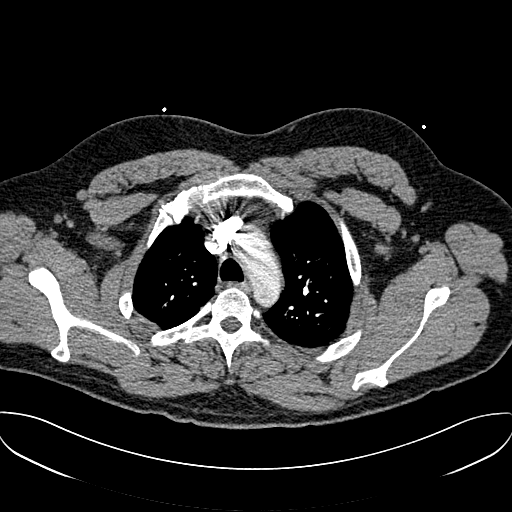
[im 193/249  lung]
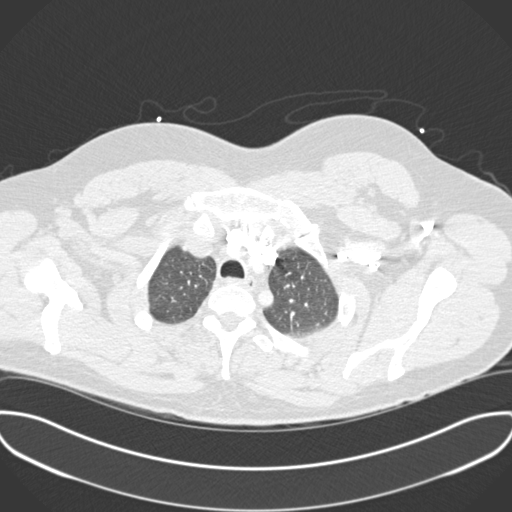
[im 207/249  mediastinal]
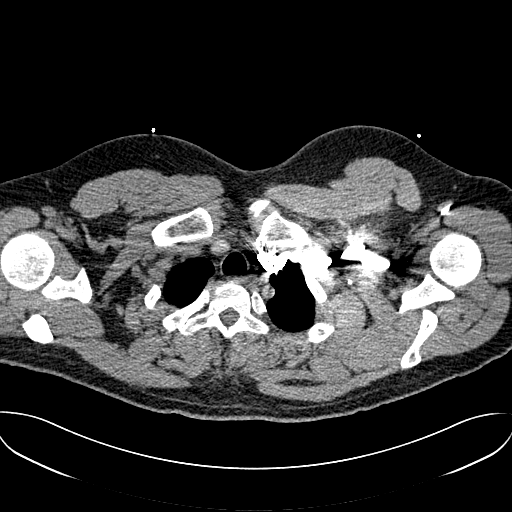
[im 221/249  lung]
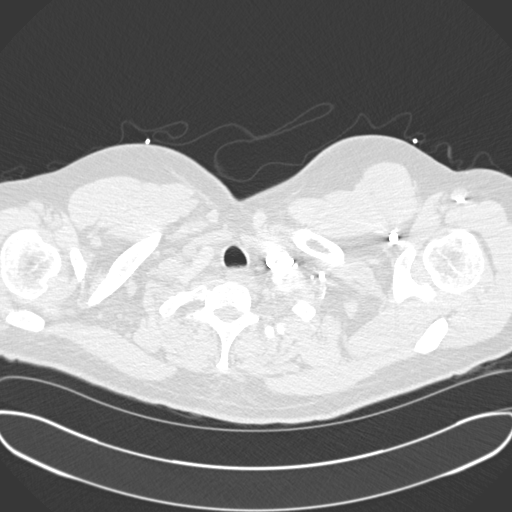
[im 235/249  mediastinal]
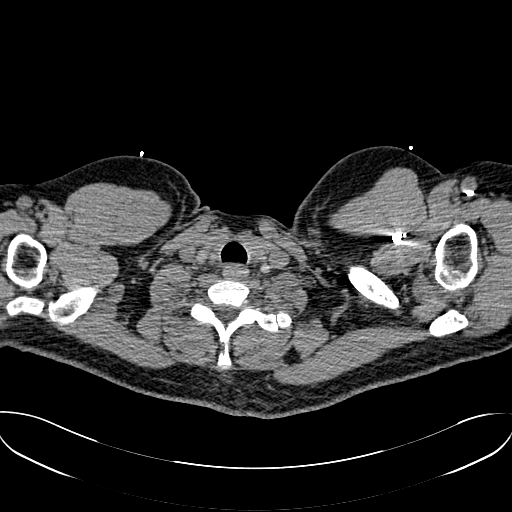

[18 of 30 positions shown; findings below may reference images not displayed]

FINDINGS: There is a cluster of nodularity in the left upper lobe most
compatible with an infectious process possibly an atypical
pneumonia. Correlation with clinical exam and sputum cultures
recommended. There is diffuse ground-glass opacities of the lungs
with small areas of air trapping which may represent small vessel
versus a small airway disease. There is no focal consolidation no
pleural effusion or pneumothorax. The central airways are patent.

The thoracic aorta appears unremarkable. The right lower lobe
subsegmental pulmonary artery branch embolus identified. There is no
cardiomegaly or pericardial effusion. Top-normal left hilar lymph
node. There is no mediastinal adenopathy. The visualized esophagus
appears unremarkable.

There is no axillary adenopathy. The chest wall soft tissues appear
unremarkable. The osseous structures are intact.

The visualized upper abdomen is grossly unremarkable.

Review of the MIP images confirms the above findings.
IMPRESSION: Small right lower lobe subsegmental pulmonary artery branches
embolus. No evidence of right cardiac strain.

Focal subpleural left upper lobe cluster of nodules concerning for
pneumonia. Clinical correlation is recommended.

Critical Value/emergent results were called by telephone at the time
of interpretation on 02/15/2015 at [DATE] to Dr. Amin Agha, who verbally
acknowledged these results.

## 2019-03-24 ENCOUNTER — Inpatient Hospital Stay
Admit: 2019-03-24 | Discharge: 2019-03-24 | Disposition: A | Discharge status: Home / Self Care | Payer: MEDICAID | Admitting: Surgery

## 2019-03-24 ENCOUNTER — Emergency Department: Admit: 2019-03-24 | Payer: MEDICAID

## 2019-03-24 LAB — CBC WITH AUTO DIFFERENTIAL
Basophils Absolute: 0.05 E9/L (ref 0.00–0.20)
Eosinophils %: 3 % (ref 0.0–6.0)
Hematocrit: 45.1 % (ref 37.0–54.0)
Hemoglobin: 14.7 g/dL (ref 12.5–16.5)
Immature Granulocytes #: 0.02 E9/L
Immature Granulocytes %: 0.2 % (ref 0.0–5.0)
Lymphocytes %: 37.3 % (ref 20.0–42.0)
Lymphocytes Absolute: 3.11 E9/L (ref 1.50–4.00)
MCH: 29.6 pg (ref 26.0–35.0)
MCHC: 32.6 % (ref 32.0–34.5)
MCV: 90.9 fL (ref 80.0–99.9)
MPV: 9 fL (ref 7.0–12.0)
Monocytes %: 7.9 % (ref 2.0–12.0)
Monocytes Absolute: 0.66 E9/L (ref 0.10–0.95)
Neutrophils Absolute: 4.24 E9/L (ref 1.80–7.30)
RDW: 12.8 fL (ref 11.5–15.0)
WBC: 8.3 E9/L (ref 4.5–11.5)

## 2019-03-24 LAB — COMPREHENSIVE METABOLIC PANEL
AST: 14 U/L (ref 0–39)
Alkaline Phosphatase: 71 U/L (ref 40–129)
Anion Gap: 8 mmol/L (ref 7–16)
BUN: 16 mg/dL (ref 6–20)
CO2: 24 mmol/L (ref 22–29)
Calcium: 8.7 mg/dL (ref 8.6–10.2)
Chloride: 105 mmol/L (ref 98–107)
Creatinine: 1.2 mg/dL (ref 0.7–1.2)
GFR African American: >60
GFR Non-African American: >60 mL/min/1.73 (ref >=60)
Glucose: 105 mg/dL — ABNORMAL HIGH (ref 74–99)
Total Bilirubin: <0.2 mg/dL (ref 0.0–1.2)
Total Protein: 7.1 g/dL (ref 6.4–8.3)

## 2019-03-24 LAB — LIPASE: Lipase: 22 U/L (ref 13–60)

## 2019-03-24 LAB — LACTIC ACID: Lactic Acid: 1.1 mmol/L (ref 0.5–2.2)

## 2019-03-24 LAB — COVID-19: SARS-CoV-2, NAAT: NOT DETECTED

## 2019-03-24 MED ORDER — HYDROMORPHONE HCL 1 MG/ML IJ SOLN
1 MG/ML | INTRAMUSCULAR | Status: DC | PRN
Start: 2019-03-24 — End: 2019-03-24
  Administered 2019-03-24: 16:00:00 0.5 mg via INTRAVENOUS

## 2019-03-24 MED ORDER — ONDANSETRON HCL 4 MG/2ML IJ SOLN
4 MG/2ML | Freq: Once | INTRAMUSCULAR | Status: AC
Start: 2019-03-24 — End: 2019-03-24
  Administered 2019-03-24: 11:00:00 4 mg via INTRAVENOUS

## 2019-03-24 MED ORDER — SUCCINYLCHOLINE CHLORIDE 200 MG/10ML IV SOSY
200 | INTRAVENOUS | Status: AC
Start: 2019-03-24 — End: 2019-03-24

## 2019-03-24 MED ORDER — MORPHINE SULFATE (PF) 2 MG/ML IV SOLN
2 | INTRAVENOUS | Status: DC | PRN
Start: 2019-03-24 — End: 2019-03-24

## 2019-03-24 MED ORDER — LIDOCAINE HCL 2 % IJ SOLN
2 % | INTRAMUSCULAR | Status: DC | PRN
Start: 2019-03-24 — End: 2019-03-24
  Administered 2019-03-24: 15:00:00 100 via INTRAVENOUS

## 2019-03-24 MED ORDER — ROCURONIUM BROMIDE 100 MG/10ML IV SOLN
100 | INTRAVENOUS | Status: AC
Start: 2019-03-24 — End: 2019-03-24

## 2019-03-24 MED ORDER — NEOSTIGMINE METHYLSULFATE 3 MG/3ML IV SOSY
3 | INTRAVENOUS | Status: AC
Start: 2019-03-24 — End: 2019-03-24

## 2019-03-24 MED ORDER — NEOSTIGMINE METHYLSULFATE 3 MG/3ML IV SOSY
3 | INTRAVENOUS | Status: DC | PRN
Start: 2019-03-24 — End: 2019-03-24

## 2019-03-24 MED ORDER — HYDROCODONE-ACETAMINOPHEN 5-325 MG PO TABS
5-325 MG | ORAL | Status: DC | PRN
Start: 2019-03-24 — End: 2019-03-24

## 2019-03-24 MED ORDER — ONDANSETRON 4 MG PO TBDP
4 MG | Freq: Three times a day (TID) | ORAL | Status: DC | PRN
Start: 2019-03-24 — End: 2019-03-24

## 2019-03-24 MED ORDER — IOPAMIDOL 76 % IV SOLN
76 % | Freq: Once | INTRAVENOUS | Status: AC | PRN
Start: 2019-03-24 — End: 2019-03-24
  Administered 2019-03-24: 12:00:00 80 mL via INTRAVENOUS

## 2019-03-24 MED ORDER — MORPHINE SULFATE 4 MG/ML IJ SOLN
4 MG/ML | INTRAMUSCULAR | Status: DC | PRN
Start: 2019-03-24 — End: 2019-03-24

## 2019-03-24 MED ORDER — HYDROMORPHONE HCL PF 1 MG/ML IJ SOLN
1 | INTRAMUSCULAR | Status: DC | PRN
Start: 2019-03-24 — End: 2019-03-24

## 2019-03-24 MED ORDER — MIDAZOLAM HCL 2 MG/2ML IJ SOLN
2 | INTRAMUSCULAR | Status: AC
Start: 2019-03-24 — End: 2019-03-24

## 2019-03-24 MED ORDER — BUPIVACAINE-EPINEPHRINE (PF) 0.25% -1:200000 IJ SOLN
INTRAMUSCULAR | Status: DC | PRN
Start: 2019-03-24 — End: 2019-03-24
  Administered 2019-03-24: 15:00:00 30 via INTRADERMAL

## 2019-03-24 MED ORDER — IBUPROFEN 800 MG PO TABS
800 MG | ORAL_TABLET | Freq: Four times a day (QID) | ORAL | 0 refills | Status: DC | PRN
Start: 2019-03-24 — End: 2019-04-19

## 2019-03-24 MED ORDER — HYDROMORPHONE HCL 1 MG/ML IJ SOLN
1 MG/ML | INTRAMUSCULAR | Status: AC
Start: 2019-03-24 — End: 2019-03-24
  Administered 2019-03-24: 16:00:00 0.5 via INTRAVENOUS

## 2019-03-24 MED ORDER — MEPERIDINE HCL 25 MG/ML IJ SOLN
25 MG/ML | INTRAMUSCULAR | Status: DC | PRN
Start: 2019-03-24 — End: 2019-03-24

## 2019-03-24 MED ORDER — METRONIDAZOLE IN NACL 5-0.79 MG/ML-% IV SOLN
5 mg/mL | Freq: Three times a day (TID) | INTRAVENOUS | Status: DC
Start: 2019-03-24 — End: 2019-03-24
  Administered 2019-03-24: 14:00:00 500 mg via INTRAVENOUS

## 2019-03-24 MED ORDER — ROCURONIUM BROMIDE 100 MG/10ML IV SOLN
100 | INTRAVENOUS | Status: DC | PRN
Start: 2019-03-24 — End: 2019-03-24
  Administered 2019-03-24: 15:00:00 40 via INTRAVENOUS

## 2019-03-24 MED ORDER — NORMAL SALINE FLUSH 0.9 % IV SOLN
0.9 | Freq: Two times a day (BID) | INTRAVENOUS | Status: DC
Start: 2019-03-24 — End: 2019-03-24

## 2019-03-24 MED ORDER — GLYCOPYRROLATE 1 MG/5ML IV SOSY
1 | INTRAVENOUS | Status: AC
Start: 2019-03-24 — End: 2019-03-24

## 2019-03-24 MED ORDER — METRONIDAZOLE IN NACL 5-0.79 MG/ML-% IV SOLN
5 | Freq: Three times a day (TID) | INTRAVENOUS | Status: DC
Start: 2019-03-24 — End: 2019-03-24

## 2019-03-24 MED ORDER — BUPIVACAINE-EPINEPHRINE (PF) 0.25% -1:200000 IJ SOLN
INTRAMUSCULAR | Status: AC
Start: 2019-03-24 — End: 2019-03-24

## 2019-03-24 MED ORDER — LACTATED RINGERS IV SOLN
INTRAVENOUS | Status: DC
Start: 2019-03-24 — End: 2019-03-24
  Administered 2019-03-24: 15:00:00 via INTRAVENOUS

## 2019-03-24 MED ORDER — SUGAMMADEX SODIUM 500 MG/5ML IV SOLN
5005 MG/5ML | INTRAVENOUS | Status: DC | PRN
Start: 2019-03-24 — End: 2019-03-24
  Administered 2019-03-24: 15:00:00 408 via INTRAVENOUS

## 2019-03-24 MED ORDER — NORMAL SALINE FLUSH 0.9 % IV SOLN
0.9 | INTRAVENOUS | Status: DC | PRN
Start: 2019-03-24 — End: 2019-03-24

## 2019-03-24 MED ORDER — HYDROMORPHONE HCL 1 MG/ML IJ SOLN
1 MG/ML | INTRAMUSCULAR | Status: DC
Start: 2019-03-24 — End: 2019-03-24

## 2019-03-24 MED ORDER — ONDANSETRON HCL 4 MG/2ML IJ SOLN
4 | INTRAMUSCULAR | Status: AC
Start: 2019-03-24 — End: 2019-03-24

## 2019-03-24 MED ORDER — ONDANSETRON HCL 4 MG/2ML IJ SOLN
4 MG/2ML | Freq: Three times a day (TID) | INTRAMUSCULAR | Status: DC | PRN
Start: 2019-03-24 — End: 2019-03-24

## 2019-03-24 MED ORDER — ONDANSETRON HCL 4 MG/2ML IJ SOLN
4 MG/2ML | INTRAMUSCULAR | Status: DC | PRN
Start: 2019-03-24 — End: 2019-03-24
  Administered 2019-03-24: 15:00:00 4 via INTRAVENOUS

## 2019-03-24 MED ORDER — FENTANYL CITRATE (PF) 250 MCG/5ML IJ SOLN
250 MCG/5ML | INTRAMUSCULAR | Status: DC | PRN
Start: 2019-03-24 — End: 2019-03-24
  Administered 2019-03-24 (×2): 50 via INTRAVENOUS
  Administered 2019-03-24: 15:00:00 150 via INTRAVENOUS

## 2019-03-24 MED ORDER — PROPOFOL 200 MG/20ML IV EMUL
200 | INTRAVENOUS | Status: AC
Start: 2019-03-24 — End: 2019-03-24

## 2019-03-24 MED ORDER — STERILE WATER FOR INJECTION (MIXTURES ONLY)
2 g | INTRAMUSCULAR | Status: DC
Start: 2019-03-24 — End: 2019-03-24
  Administered 2019-03-24: 13:00:00 2000 mg via INTRAVENOUS

## 2019-03-24 MED ORDER — HYDROMORPHONE HCL PF 1 MG/ML IJ SOLN
1 MG/ML | INTRAMUSCULAR | Status: DC | PRN
Start: 2019-03-24 — End: 2019-03-24
  Administered 2019-03-24: 13:00:00 1 mg via INTRAVENOUS

## 2019-03-24 MED ORDER — STERILE WATER FOR INJECTION (MIXTURES ONLY)
1 | Freq: Every day | INTRAMUSCULAR | Status: DC
Start: 2019-03-24 — End: 2019-03-24

## 2019-03-24 MED ORDER — FENTANYL CITRATE (PF) 100 MCG/2ML IJ SOLN
100 MCG/2ML | Freq: Once | INTRAMUSCULAR | Status: AC
Start: 2019-03-24 — End: 2019-03-24
  Administered 2019-03-24: 11:00:00 25 ug via INTRAVENOUS

## 2019-03-24 MED ORDER — SODIUM CHLORIDE 0.9 % IV SOLN
0.9 % | INTRAVENOUS | Status: DC
Start: 2019-03-24 — End: 2019-03-24
  Administered 2019-03-24: 17:00:00 via INTRAVENOUS

## 2019-03-24 MED ORDER — MIDAZOLAM HCL 2 MG/2ML IJ SOLN
2 MG/ML | INTRAMUSCULAR | Status: DC | PRN
Start: 2019-03-24 — End: 2019-03-24
  Administered 2019-03-24: 15:00:00 2 via INTRAVENOUS

## 2019-03-24 MED ORDER — GLYCOPYRROLATE 1 MG/5ML IV SOSY
1 | INTRAVENOUS | Status: DC | PRN
Start: 2019-03-24 — End: 2019-03-24

## 2019-03-24 MED ORDER — PROPOFOL 200 MG/20ML IV EMUL
20020 MG/20ML | INTRAVENOUS | Status: DC | PRN
Start: 2019-03-24 — End: 2019-03-24
  Administered 2019-03-24: 15:00:00 200 via INTRAVENOUS

## 2019-03-24 MED ORDER — ENOXAPARIN SODIUM 40 MG/0.4ML SC SOLN
400.4 MG/0.4ML | Freq: Every day | SUBCUTANEOUS | Status: DC
Start: 2019-03-24 — End: 2019-03-24

## 2019-03-24 MED ORDER — LACTATED RINGERS IV SOLN
Freq: Once | INTRAVENOUS | Status: DC
Start: 2019-03-24 — End: 2019-03-24

## 2019-03-24 MED ORDER — FENTANYL CITRATE (PF) 250 MCG/5ML IJ SOLN
250 | INTRAMUSCULAR | Status: AC
Start: 2019-03-24 — End: 2019-03-24

## 2019-03-24 MED ORDER — HYDROCODONE-ACETAMINOPHEN 5-325 MG PO TABS
5-325 MG | ORAL_TABLET | Freq: Four times a day (QID) | ORAL | 0 refills | Status: AC | PRN
Start: 2019-03-24 — End: 2019-03-27

## 2019-03-24 MED FILL — DIPRIVAN 200 MG/20ML IV EMUL: 200 MG/20ML | INTRAVENOUS | Qty: 20

## 2019-03-24 MED FILL — HYDROMORPHONE HCL 1 MG/ML IJ SOLN: 1 mg/mL | INTRAMUSCULAR | Qty: 0.5

## 2019-03-24 MED FILL — CEFTRIAXONE SODIUM 2 G IJ SOLR: 2 g | INTRAMUSCULAR | Qty: 2000

## 2019-03-24 MED FILL — ONDANSETRON HCL 4 MG/2ML IJ SOLN: 4 MG/2ML | INTRAMUSCULAR | Qty: 2

## 2019-03-24 MED FILL — FENTANYL CITRATE (PF) 250 MCG/5ML IJ SOLN: 250 MCG/5ML | INTRAMUSCULAR | Qty: 5

## 2019-03-24 MED FILL — FENTANYL CITRATE (PF) 100 MCG/2ML IJ SOLN: 100 MCG/2ML | INTRAMUSCULAR | Qty: 2

## 2019-03-24 MED FILL — ROCURONIUM BROMIDE 100 MG/10ML IV SOLN: 100 MG/10ML | INTRAVENOUS | Qty: 10

## 2019-03-24 MED FILL — GLYCOPYRROLATE 1 MG/5ML IV SOSY: 1 MG/5ML | INTRAVENOUS | Qty: 5

## 2019-03-24 MED FILL — MIDAZOLAM HCL 2 MG/2ML IJ SOLN: 2 mg/mL | INTRAMUSCULAR | Qty: 2

## 2019-03-24 MED FILL — SUCCINYLCHOLINE CHLORIDE 200 MG/10ML IV SOSY: 200 MG/10ML | INTRAVENOUS | Qty: 10

## 2019-03-24 MED FILL — METRONIDAZOLE IN NACL 5-0.79 MG/ML-% IV SOLN: 5 mg/mL | INTRAVENOUS | Qty: 100

## 2019-03-24 MED FILL — BUPIVACAINE-EPINEPHRINE (PF) 0.25% -1:200000 IJ SOLN: INTRAMUSCULAR | Qty: 10

## 2019-03-24 MED FILL — HYDROMORPHONE HCL 1 MG/ML IJ SOLN: 1 mg/mL | INTRAMUSCULAR | Qty: 1

## 2019-03-24 MED FILL — NEOSTIGMINE METHYLSULFATE 3 MG/3ML IV SOSY: 3 mg/mL | INTRAVENOUS | Qty: 3

## 2019-03-24 MED FILL — ONDANSETRON 4 MG PO TBDP: 4 mg | ORAL | Qty: 1

## 2019-03-24 NOTE — H&P (Signed)
General Surgery History and Physical      Patient's Name/Date of Birth: Walter Farmer / March 08, 1980    Date: March 24, 2019     PCP: No primary care provider on file.     Chief Complaint: RUQ pain    HPI:   Walter Farmer is a 40 y.o. male who presents for evaluation of right-sided abdominal pain. Timing is 2.5 days, radiation to nowhere, alleviated by pain medication and started gradually and severity is 9/10.  He also complains of nausea, he last ate at 6:00 yesterday, denies any anticoagulant use or medication use.  He states he is intermittently been having right-sided abdominal pain for the last month but this time is much worse and accompanied by chills.  He has no personal or family history of cancer or abdominal surgeries.  He has never had an EGD or colonoscopy    Patient Active Problem List   Diagnosis   ??? Pulmonary embolism without acute cor pulmonale (HCC)   ??? FHx: factor V Leiden mutation   ??? Acute appendicitis       Past Medical History:   Diagnosis Date   ??? GERD (gastroesophageal reflux disease)    ??? Neuropathy     right shouolder to fingers-due to a pinched nerve   ??? PE (pulmonary thromboembolism) (HCC)        Past Surgical History:   Procedure Laterality Date   ??? KNEE SURGERY Right    ??? TONSILLECTOMY         No Known Allergies    The patient has a family history that is negative for severe cardiovascular or respiratory issues, negative for reaction to anesthesia.     Time spent reviewing past medical, surgical, social and family history, vitals, nursing assessment and images. No changes from above documented history.    Social History     Socioeconomic History   ??? Marital status: Married     Spouse name: Not on file   ??? Number of children: Not on file   ??? Years of education: Not on file   ??? Highest education level: Not on file   Occupational History   ??? Not on file   Social Needs   ??? Financial resource strain: Not on file   ??? Food insecurity     Worry: Not on file     Inability: Not on file   ???  Transportation needs     Medical: Not on file     Non-medical: Not on file   Tobacco Use   ??? Smoking status: Heavy Tobacco Smoker     Packs/day: 1.00     Years: 10.00     Pack years: 10.00   ??? Smokeless tobacco: Current User     Types: Chew   Substance and Sexual Activity   ??? Alcohol use: No     Comment: very rare   ??? Drug use: Not Currently   ??? Sexual activity: Yes   Lifestyle   ??? Physical activity     Days per week: Not on file     Minutes per session: Not on file   ??? Stress: Not on file   Relationships   ??? Social Wellsite geologist on phone: Not on file     Gets together: Not on file     Attends religious service: Not on file     Active member of club or organization: Not on file     Attends meetings of  clubs or organizations: Not on file     Relationship status: Not on file   ??? Intimate partner violence     Fear of current or ex partner: Not on file     Emotionally abused: Not on file     Physically abused: Not on file     Forced sexual activity: Not on file   Other Topics Concern   ??? Not on file   Social History Narrative   ??? Not on file       I have reviewed relevant labs from this admission and interpretation is included in my assessment and plan    Review of Systems    A complete 10 system review was performed and are otherwise negative unless mentioned in the above HPI. Specific negatives are listed below but may not include all those reviewed.    General ROS: negative obtundation, AMS  ENT ROS: negative rhinorrhea, epistaxis  Allergy and Immunology ROS: negative itchy/watery eyes or nasal congestion  Hematological and Lymphatic ROS: negative spontaneous bleeding or bruising  Endocrine ROS: negative  lethargy, mood swings, palpitations or polydipsia/polyuria  Respiratory ROS: negative sputum changes, stridor, tachypnea or wheezing  Cardiovascular ROS: negative for - loss of consciousness, murmur or orthopnea  Gastrointestinal ROS: negative for - hematochezia or hematemesis  Genito-Urinary ROS: negative  for -  genital discharge or hematuria  Musculoskeletal ROS: negative for - focal weakness, gangrene  Psych/Neuro ROS: negative for - visual or auditory hallucinations, suicidal ideation    Physical exam:   BP 127/88    Pulse 66    Temp 97.9 ??F (36.6 ??C) (Infrared)    Resp 18    Ht 5\' 8"  (1.727 m)    Wt 225 lb (102.1 kg)    SpO2 99%    BMI 34.21 kg/m??   General appearance:  NAD, appears stated age  Head: NCAT, PERRLA, EOMI, red conjunctiva  Neck: supple, no masses, trachea midline  Lungs: Equal chest rise bilateral, no retractions, no wheezing  Heart: Reg rate  Abdomen: soft, right-sided severe abdominal tenderness, positive Rovsing's sign, mildly distended  Skin; warm and dry, no cyanosis  Gu: no cva tenderness  Extremities: atraumatic, no focal motor deficits, no open wounds  Psych: No tremor, visual hallucinations      Radiology: I reviewed relevant abdominal imaging from this admission and that available in the EMR including CT abd/pel from 1/27. My assessment is acute appendicitis    Assessment:  Walter Farmer is a 39 y.o. male with acute appendicitis  Patient Active Problem List   Diagnosis   ??? Pulmonary embolism without acute cor pulmonale (HCC)   ??? FHx: factor V Leiden mutation   ??? Acute appendicitis         Plan:  N.p.o.  IV fluids  Antibiotics  OR for laparoscopic possible open appendectomy  Risks and benefits explained, patient agreeable to procedure    Discussed with Dr. 24, MD  8:02 AM  03/24/2019     General Surgery History and Physical    Patient's Name/Date of Birth: Walter Farmer / 11/23/1980    Date: March 24, 2019     Surgeon: March 26, 2019 M.D.    PCP: No primary care provider on file.     Chief Complaint: abdominal pain    HPI:   Walter Farmer is a 39 y.o. male who presents for evaluation of abdominal pain that started 2 days ago.. Timing  is constant, radiation to right lower quadrant, alleviated by nothing and started suddenly.      Past Medical History:   Diagnosis  Date   ??? GERD (gastroesophageal reflux disease)    ??? Neuropathy     right shouolder to fingers-due to a pinched nerve   ??? PE (pulmonary thromboembolism) ([place])        Past Surgical History:   Procedure Laterality Date   ??? KNEE SURGERY Right    ??? TONSILLECTOMY         Current Facility-Administered Medications   Medication Dose Route Frequency Provider Last Rate Last Admin   ??? HYDROmorphone HCl PF (DILAUDID) injection 0.5 mg  0.5 mg Intravenous Q2H PRN Hubbard Hartshorn, MD        Or   ??? HYDROmorphone HCl PF (DILAUDID) injection 1 mg  1 mg Intravenous Q2H PRN Hubbard Hartshorn, MD   1 mg at 03/24/19 0817   ??? cefTRIAXone (ROCEPHIN) 2,000 mg in sterile water 20 mL IV syringe  2,000 mg Intravenous Q24H Hubbard Hartshorn, MD   2,000 mg at 03/24/19 2536   ??? metronidazole (FLAGYL) 500 mg in NaCl 100 mL IVPB premix  500 mg Intravenous Q8H Hubbard Hartshorn, MD 100 mL/hr at 03/24/19 0830 500 mg at 03/24/19 0830   ??? lactated ringers infusion 1,000 mL  1,000 mL Intravenous Once Hubbard Hartshorn, MD       ??? lactated ringers infusion   Intravenous Continuous Hubbard Hartshorn, MD         No current outpatient medications on file.       No Known Allergies    The patient has a family history that is negative for severe cardiovascular or respiratory issues, negative for reaction to anesthesia.    Social History     Socioeconomic History   ??? Marital status: Married     Spouse name: Not on file   ??? Number of children: Not on file   ??? Years of education: Not on file   ??? Highest education level: Not on file   Occupational History   ??? Not on file   Social Needs   ??? Financial resource strain: Not on file   ??? Food insecurity     Worry: Not on file     Inability: Not on file   ??? Transportation needs     Medical: Not on file     Non-medical: Not on file   Tobacco Use   ??? Smoking status: Heavy Tobacco Smoker     Packs/day: 1.00     Years: 10.00     Pack years: 10.00   ??? Smokeless tobacco: Current User     Types: Chew   Substance and Sexual Activity   ??? Alcohol  use: No     Comment: very rare   ??? Drug use: Not Currently   ??? Sexual activity: Yes   Lifestyle   ??? Physical activity     Days per week: Not on file     Minutes per session: Not on file   ??? Stress: Not on file   Relationships   ??? Social Product manager on phone: Not on file     Gets together: Not on file     Attends religious service: Not on file     Active member of club or organization: Not on file     Attends meetings of clubs or organizations: Not on file  Relationship status: Not on file   ??? Intimate partner violence     Fear of current or ex partner: Not on file     Emotionally abused: Not on file     Physically abused: Not on file     Forced sexual activity: Not on file   Other Topics Concern   ??? Not on file   Social History Narrative   ??? Not on file           Review of Systems  Review of Systems -  General ROS: negative for - chills, fatigue or malaise  ENT ROS: negative for - hearing change, nasal congestion or nasal discharge  Allergy and Immunology ROS: negative for - hives, itchy/watery eyes or nasal congestion  Hematological and Lymphatic ROS: negative for - blood clots, blood transfusions, bruising or fatigue  Endocrine ROS: negative for - malaise/lethargy, mood swings, palpitations or polydipsia/polyuria  Breast ROS: negative for - new or changing breast lumps or nipple changes  Respiratory ROS: negative for - sputum changes, stridor, tachypnea or wheezing  Cardiovascular ROS: negative for - irregular heartbeat, loss of consciousness, murmur or orthopnea  Gastrointestinal ROS: negative for - constipation, diarrhea, gas/bloating, heartburn or hematemesis, positive for pain and acute anorexia.  Genito-Urinary ROS: negative for -  genital discharge, genital ulcers or hematuria  Musculoskeletal ROS: negative for - gait disturbance, muscle pain or muscular weakness    Physical exam:   BP 129/83    Pulse 53    Temp 97.9 ??F (36.6 ??C) (Infrared)    Resp 18    Ht 5\' 8"  (1.727 m)    Wt 225 lb (102.1  kg)    SpO2 98%    BMI 34.21 kg/m??   General appearance:  NAD  Pyscho/social: negative for tremors and hallucinations  Head: NCAT, PERRLA, EOMI, red conjunctiva  Neck: supple, no masses  Lungs: CTAB, equal chest rise bilateral  Heart: Reg rate  Abdomen: soft, tender in right lower quadrant, nondistended  Skin; no lesions  Gu: no cva tenderness  Extremities: extremities normal, atraumatic, no cyanosis or edema      Radiology:  CT abdomen/pelvis: acute appendicitis    Assessment:  39 y.o. male with acute appendicitis    Plan:  To OR for lap appy  Discussed the risk, benefits and alternatives of surgery including wound infections, bleeding, scar and hernia formation and the risks of general anesthetic including MI, CVA, sudden death or reactions to anesthetic medications. The patient understands the risks and alternatives and the possibility of converting to an open procedure. All questions were answered to the patient's satisfaction and they freely signed the consent.      24, MD  8:37 AM  03/24/2019

## 2019-03-24 NOTE — ED Provider Notes (Signed)
Patient is a 39 year old male who presents with a chief complaint of right-sided abdominal pain.  Patient states that his symptoms started 2 days ago with right side abdominal pain.  It initially had resolved but then has been more constant causing him nausea with nonbloody nonbilious emesis.  He describes as being a sharp, stabbing sensation.  Nothing makes it better or worse.  Patient denies any fevers, chills, chest pain, shortness of breath, dysuria, hematuria, diarrhea, constipation.  No history of abdominal surgeries in the past.  No suspicious food intake or new medications.  No recent illnesses.    The history is provided by the patient. No language interpreter was used.        Review of Systems   Constitutional: Negative for chills, fatigue and fever.   HENT: Negative for congestion, rhinorrhea, sinus pressure, sinus pain, sneezing, sore throat and tinnitus.    Eyes: Negative for photophobia and visual disturbance.   Respiratory: Negative for cough, shortness of breath and wheezing.    Cardiovascular: Negative for chest pain and palpitations.   Gastrointestinal: Positive for abdominal pain, nausea and vomiting. Negative for constipation and diarrhea.   Genitourinary: Negative for dysuria, frequency and hematuria.   Musculoskeletal: Negative for back pain, neck pain and neck stiffness.   Skin: Negative for rash and wound.   Neurological: Negative for dizziness, light-headedness and headaches.   Psychiatric/Behavioral: Negative for agitation, behavioral problems and confusion.        Physical Exam  Constitutional:       General: He is not in acute distress.     Appearance: He is not toxic-appearing.   HENT:      Head: Normocephalic.      Mouth/Throat:      Mouth: Mucous membranes are moist.   Eyes:      General: No scleral icterus.     Pupils: Pupils are equal, round, and reactive to light.   Neck:      Musculoskeletal: Normal range of motion. No neck rigidity.   Cardiovascular:      Rate and Rhythm:  Normal rate.      Heart sounds: No murmur. No gallop.    Pulmonary:      Effort: No respiratory distress.      Breath sounds: Normal breath sounds. No wheezing.   Abdominal:      General: There is no distension.      Palpations: Abdomen is soft.      Tenderness: There is abdominal tenderness.      Comments: Patient does have right upper quadrant and right lower quadrant abdominal tenderness to palpation.  Abdomen is otherwise soft, nondistended.  No guarding rigidity.   Musculoskeletal: Normal range of motion.         General: No swelling or deformity.   Lymphadenopathy:      Cervical: No cervical adenopathy.   Skin:     General: Skin is warm.      Capillary Refill: Capillary refill takes less than 2 seconds.      Coloration: Skin is not jaundiced.      Findings: No bruising.   Neurological:      Mental Status: He is alert and oriented to person, place, and time.      Cranial Nerves: No cranial nerve deficit.      Motor: No weakness.   Psychiatric:         Mood and Affect: Mood normal.         Thought Content: Thought content normal.  Procedures     MDM  Number of Diagnoses or Management Options  Acute appendicitis, unspecified acute appendicitis type  Diagnosis management comments: Patient is a 39 year old male who presented with a chief complaint of worsening right-sided abdominal pain that started 2 days ago.  The patient has also had nausea with nonbloody nonbilious emesis.  On exam he is tender the right upper and lower quadrants.  CT the abdomen did show an enlarged appendix consistent with acute appendicitis.  The patient has no preference for a surgeon, he also has no PCP.  The patient was agreeable to use the on-call surgeon which is Dr.Romain.  Discussed the case with the surgeon they will admit the patient and place orders.  Covid test was ordered.       ED Course as of Mar 23 749   Wed Mar 24, 2019   0739 Updated on his imaging results.  He has no preference for general surgeon, he is agreeable  for admission.  His last oral intake was at 6:30 PM last evening.    [MS]      ED Course User Index  [MS] Hermine Messick, DO      --------------------------------------------- PAST HISTORY ---------------------------------------------  Past Medical History:  has a past medical history of GERD (gastroesophageal reflux disease), Neuropathy, and PE (pulmonary thromboembolism) ([place]).    Past Surgical History:  has a past surgical history that includes Tonsillectomy and knee surgery (Right).    Social History:  reports that he has been smoking. He has a 10.00 pack-year smoking history. His smokeless tobacco use includes chew. He reports previous drug use. He reports that he does not drink alcohol.    Family History: family history includes COPD in his mother; Diabetes in his father; High Blood Pressure in his mother; High Cholesterol in his mother; Mult Sclerosis in his paternal grandmother; No Known Problems in his paternal grandfather; Obesity in his mother; Other in his brother and mother; Scoliosis in his sister; Stroke in his mother.     The patient???s home medications have been reviewed.    Allergies: Patient has no known allergies.    -------------------------------------------------- RESULTS -------------------------------------------------    LABS:  Results for orders placed or performed during the hospital encounter of 03/24/19   CBC Auto Differential   Result Value Ref Range    WBC 8.3 4.5 - 11.5 E9/L    RBC 4.96 3.80 - 5.80 E12/L    Hemoglobin 14.7 12.5 - 16.5 g/dL    Hematocrit 45.1 37.0 - 54.0 %    MCV 90.9 80.0 - 99.9 fL    MCH 29.6 26.0 - 35.0 pg    MCHC 32.6 32.0 - 34.5 %    RDW 12.8 11.5 - 15.0 fL    Platelets 338 130 - 450 E9/L    MPV 9.0 7.0 - 12.0 fL    Neutrophils % 51.0 43.0 - 80.0 %    Immature Granulocytes % 0.2 0.0 - 5.0 %    Lymphocytes % 37.3 20.0 - 42.0 %    Monocytes % 7.9 2.0 - 12.0 %    Eosinophils % 3.0 0.0 - 6.0 %    Basophils % 0.6 0.0 - 2.0 %    Neutrophils Absolute 4.24 1.80 - 7.30  E9/L    Immature Granulocytes # 0.02 E9/L    Lymphocytes Absolute 3.11 1.50 - 4.00 E9/L    Monocytes Absolute 0.66 0.10 - 0.95 E9/L    Eosinophils Absolute 0.25 0.05 - 0.50 E9/L  Basophils Absolute 0.05 0.00 - 0.20 E9/L   Comprehensive metabolic panel   Result Value Ref Range    Sodium 137 132 - 146 mmol/L    Potassium 4.2 3.5 - 5.0 mmol/L    Chloride 105 98 - 107 mmol/L    CO2 24 22 - 29 mmol/L    Anion Gap 8 7 - 16 mmol/L    Glucose 105 (H) 74 - 99 mg/dL    BUN 16 6 - 20 mg/dL    CREATININE 1.2 0.7 - 1.2 mg/dL    GFR Non-African American >60 >=60 mL/min/1.73    GFR African American >60     Calcium 8.7 8.6 - 10.2 mg/dL    Total Protein 7.1 6.4 - 8.3 g/dL    Albumin 4.1 3.5 - 5.2 g/dL    Total Bilirubin <4.1 0.0 - 1.2 mg/dL    Alkaline Phosphatase 71 40 - 129 U/L    ALT 18 0 - 40 U/L    AST 14 0 - 39 U/L   Lactic Acid, Plasma   Result Value Ref Range    Lactic Acid 1.1 0.5 - 2.2 mmol/L   Lipase   Result Value Ref Range    Lipase 22 13 - 60 U/L       RADIOLOGY:  Ct Abdomen Pelvis W Iv Contrast Additional Contrast? None    Result Date: 03/24/2019  EXAMINATION: CT OF THE ABDOMEN AND PELVIS WITH CONTRAST 03/24/2019 7:03 am TECHNIQUE: CT of the abdomen and pelvis was performed with the administration of intravenous contrast. Multiplanar reformatted images are provided for review. Dose modulation, iterative reconstruction, and/or weight based adjustment of the mA/kV was utilized to reduce the radiation dose to as low as reasonably achievable. COMPARISON: None. HISTORY: ORDERING SYSTEM PROVIDED HISTORY: right sided abdominal pain; r/o acute cholecysitis TECHNOLOGIST PROVIDED HISTORY: Reason for exam:->right sided abdominal pain; r/o acute cholecysitis Additional Contrast?->None FINDINGS: Lower Chest: The visualized portions of the lung bases are clear. Organs: The visualized liver, spleen, pancreas, adrenal glands and kidneys demonstrate no significant abnormality.  Note the gallbladder is partially contracted.  I can not  confirm cholelithiasis GI/Bowel: There are no findings of intestinal obstruction.  The appendix is enlarged measuring 11 mm diameter.  This extends superiorly with tip abutting the inferior margin of the liver.  Findings are suspicious for acute appendicitis.  There is no abscess.  There is no free air. There are no findings of intestinal obstruction. Pelvis:  Bladder is unremarkable in appearance.  There is no abnormal pelvic mass or fluid collection seen. Peritoneum/Retroperitoneum: There is no abdominal aortic aneurysm.  There is no free intraperitoneal air. Bones/Soft Tissues: There is no acute osseous or soft tissue abnormality seen.    Enlarged appendix measuring 11 mm diameter is suspicious for acute appendicitis. Partially contracted gallbladder.  I could not confirm cholelithiasis.    ------------------------- NURSING NOTES AND VITALS REVIEWED ---------------------------  The nursing notes within the ED encounter and vital signs as below have been reviewed.     Patient Vitals for the past 24 hrs:   BP Temp Temp src Pulse Resp SpO2 Height Weight   03/24/19 0524 127/88 97.9 ??F (36.6 ??C) Infrared 66 18 99 % -- --   03/24/19 0510 -- 97.9 ??F (36.6 ??C) Infrared -- -- -- 5\' 8"  (1.727 m) 225 lb (102.1 kg)       Oxygen Saturation Interpretation: Normal    ------------------------------------------ PROGRESS NOTES ------------------------------------------  Re-evaluation(s):  Please see ED course.     Counseling:  I have spoken with the patient and discussed today???s results, in addition to providing specific details for the plan of care and counseling regarding the diagnosis and prognosis.  Their questions are answered at this time and they are agreeable with the plan of admission.    --------------------------------- ADDITIONAL PROVIDER NOTES ---------------------------------  Consultations:  Time: 0748. Spoke with Dr. Keene Breath .  Discussed case.  They will admit the patient.  This patient's ED course included: a  personal history and physicial examination, re-evaluation prior to disposition, cardiac monitoring and continuous pulse oximetry    This patient has remained hemodynamically stable during their ED course.    Diagnosis:  1. Acute appendicitis, unspecified acute appendicitis type        Disposition:  Patient's disposition: Admit to med/surg floor  Patient's condition is stable.           Mike Craze, DO  03/24/19 [id number]

## 2019-03-24 NOTE — Discharge Instructions (Signed)
Patient Discharge Instructions  Discharge Date:  03/24/2019    Discharged To:  home    Home with Home Health Care: No    RESUME ACTIVITY:      BATHING: no bathing for 3 days, ok to shower    DRIVING: No driving while on pain meds    RETURN TO WORK: No lifting more than 15 pounds    WALKING:  Yes    SEXUAL ACTIVITY: Yes    STAIRS:  Yes    LIFTING: Less than 25 pounds for 2weeks then no more than 50lbs for 2 additional weeks then no limitations    DIET: common adult      BOWELS: constipation is a side effect of your pain meds, take a daily laxative (miralax, dulcolax, etc.) as needed to keep your bowels moving as they normally do, do not go 2-3 days without having a bowel movement.    Pain medications;   Percocet- take at least 1/2 pill every 6 hours the first 36 hours after surgery, and may take as many as 2 pills every 4 hours. After the first 36hours only take the pills as needed and stop them as soon as possible. Pain meds cause constipation so pay close attention to the "bowels" topic above.     SPECIAL INSTRUCTIONS:     Call the office at [phone] if you have a fever > 100 F, or if your incision becomes red, tender, or drains more than a small amount of clear fluid.    Call  for follow up appointment with Dr. Daphine Deutscher in:  2 weeks    Information obtained by:  By signing below, I understand that if any problems occur once I leave the hospital I am to contact Dr. Daphine Deutscher.  I understand and acknowledge receipt of the instructions indicated above.

## 2019-03-24 NOTE — Op Note (Signed)
SURGEON: Haskell Flirt, M.D.     ASSISTANT:dever.     PREOPERATIVE DIAGNOSIS: Acute appendicitis.     POSTOPERATIVE DIAGNOSIS: Acute appendicitis.     OPERATION: Laparoscopic appendectomy.     ANESTHESIA: General.     ESTIMATED BLOOD LOSS: Minimal.     COMPLICATIONS: None.     FLUIDS: Crystalloid.     SPECIMEN: Appendix.     DISPOSITION: Walter Farmer was to be admitted to the floor for postoperative care.     INDICATIONS FOR SURGERY:Walter Farmer is a 39 y.o. male with signs and symptoms consistent with acute appendicitis. We explained the risks, benefits, potential   outcomes, and alternatives of treatment to the aforementioned procedure, including but not limited to abscess, injury to surrounding structures, bleeding, infection, and/or obstruction with all possibly requiring further procedures. The   patient agreed to proceed with the procure understanding those risks and   potential outcomes.     PROCEDURE: The patient was brought into the operative suite. He had already   been placed on preoperative antibiotics. He was placed under general   anesthesia.  He was then prepped and draped in a normal sterile condition.   Once this was done, a 5-mm incision was made infraumbilically. A Veress   needle was passed through this into the peritoneum. A meniscus test verified   the proper location. CO2 was then used to insufflate the abdomen to a   pressure of 15 mmHg. At that time, the Veress needle was removed. A 5-mm   trocar was put in its place. A laparoscope was passed down through that   trocar. Then a 5-mm trocar was placed suprapubically. A 10-mm trocar was   placed in the left lower quadrant of the abdomen. Blunt dissection was able   to identify the base of the appendix and make a window through the base of   the mesoappendix. Once that was done, an Endo stapler was used and was fired   to get across the base of the appendix at the cecum, making sure not to   take any cuff of the cecum. Once this was done, the  vascular load in the Endo stapler was used to divide the mesoappendix.  At this point, the area   was suction irrigated until the aspirate returned clear. There was no   leaking seen at the staple line. The rest of the abdomen appeared normal.    The 2 suprapubic and left lower quadrant ports were then removed under direct   visualization with the laparoscope. The laparoscope and the 5-mm port at the   umbilicus were then removed. The fascia at the 10-mm port was closed with an   #0 Vicryl suture. Then the skin was   approximated with subcuticular stitches with 4-0 Monocryl.   The patient was then awakened in stable condition to be taken to the PACU.    Loreli Slot, MD  10:02 AM  03/24/2019

## 2019-03-29 ENCOUNTER — Ambulatory Visit: Admit: 2019-03-29 | Discharge: 2019-03-29 | Payer: MEDICAID | Attending: Surgery

## 2019-03-29 DIAGNOSIS — K3589 Other acute appendicitis without perforation or gangrene: Secondary | ICD-10-CM

## 2019-04-19 ENCOUNTER — Inpatient Hospital Stay
Admit: 2019-04-19 | Discharge: 2019-04-19 | Disposition: A | Discharge status: Home / Self Care | Payer: PRIVATE HEALTH INSURANCE

## 2019-04-19 ENCOUNTER — Emergency Department: Admit: 2019-04-19 | Payer: PRIVATE HEALTH INSURANCE

## 2019-04-19 DIAGNOSIS — R1031 Right lower quadrant pain: Secondary | ICD-10-CM

## 2019-04-19 LAB — COMPREHENSIVE METABOLIC PANEL
ALT: 21 U/L (ref 0–40)
AST: 19 U/L (ref 0–39)
Albumin: 4.5 g/dL (ref 3.5–5.2)
Alkaline Phosphatase: 73 U/L (ref 40–129)
Anion Gap: 10 mmol/L (ref 7–16)
BUN: 14 mg/dL (ref 6–20)
CO2: 26 mmol/L (ref 22–29)
Calcium: 9.2 mg/dL (ref 8.6–10.2)
Chloride: 103 mmol/L (ref 98–107)
Creatinine: 1.1 mg/dL (ref 0.7–1.2)
GFR African American: >60
GFR Non-African American: >60 mL/min/{1.73_m2} (ref >=60)
Glucose: 96 mg/dL (ref 74–99)
Potassium: 4.2 mmol/L (ref 3.5–5.0)
Total Bilirubin: 0.3 mg/dL (ref 0.0–1.2)
Total Protein: 7.5 g/dL (ref 6.4–8.3)

## 2019-04-19 LAB — URINALYSIS
Bilirubin Urine: NEGATIVE
Glucose, Ur: NEGATIVE mg/dL
Ketones, Urine: NEGATIVE mg/dL
Leukocyte Esterase, Urine: NEGATIVE
Nitrite, Urine: NEGATIVE
Protein, UA: NEGATIVE mg/dL
Specific Gravity, UA: 1.025 (ref 1.005–1.030)
Urobilinogen, Urine: 0.2 E.U./dL (ref <2.0)
pH, UA: 6 (ref 5.0–9.0)

## 2019-04-19 LAB — CBC WITH AUTO DIFFERENTIAL
Basophils %: 0.7 % (ref 0.0–2.0)
Basophils Absolute: 0.05 E9/L (ref 0.00–0.20)
Eosinophils %: 3.8 % (ref 0.0–6.0)
Eosinophils Absolute: 0.29 E9/L (ref 0.05–0.50)
Hematocrit: 46.4 % (ref 37.0–54.0)
Hemoglobin: 15.5 g/dL (ref 12.5–16.5)
Immature Granulocytes #: 0.02 E9/L
Immature Granulocytes %: 0.3 % (ref 0.0–5.0)
Lymphocytes %: 43.6 % — ABNORMAL HIGH (ref 20.0–42.0)
Lymphocytes Absolute: 3.32 E9/L (ref 1.50–4.00)
MCH: 30.1 pg (ref 26.0–35.0)
MCHC: 33.4 % (ref 32.0–34.5)
MCV: 90.1 fL (ref 80.0–99.9)
MPV: 9.2 fL (ref 7.0–12.0)
Monocytes %: 8.4 % (ref 2.0–12.0)
Monocytes Absolute: 0.64 E9/L (ref 0.10–0.95)
Neutrophils %: 43.2 % (ref 43.0–80.0)
Platelets: 334 E9/L (ref 130–450)
RBC: 5.15 E12/L (ref 3.80–5.80)
RDW: 13.1 fL (ref 11.5–15.0)
WBC: 7.6 E9/L (ref 4.5–11.5)

## 2019-04-19 LAB — MICROSCOPIC URINALYSIS: Epithelial Cells, UA: NONE SEEN /HPF

## 2019-04-19 LAB — LACTIC ACID: Lactic Acid: 0.9 mmol/L (ref 0.5–2.2)

## 2019-04-19 LAB — LIPASE: Lipase: 22 U/L (ref 13–60)

## 2019-04-19 MED ORDER — OXYCODONE-ACETAMINOPHEN 5-325 MG PO TABS
5-325 MG | ORAL_TABLET | Freq: Four times a day (QID) | ORAL | 0 refills | Status: AC | PRN
Start: 2019-04-19 — End: 2019-04-24

## 2019-04-19 MED ORDER — MORPHINE SULFATE (PF) 10 MG/ML IJ SOLN
10 MG/ML | Freq: Once | INTRAMUSCULAR | Status: AC
Start: 2019-04-19 — End: 2019-04-19
  Administered 2019-04-19: 17:00:00 6 mg via INTRAVENOUS

## 2019-04-19 MED ORDER — IOPAMIDOL 76 % IV SOLN
76 % | Freq: Once | INTRAVENOUS | Status: AC | PRN
Start: 2019-04-19 — End: 2019-04-19
  Administered 2019-04-19: 16:00:00 75 mL via INTRAVENOUS

## 2019-04-19 MED ORDER — ONDANSETRON HCL 4 MG/2ML IJ SOLN
4 MG/2ML | Freq: Once | INTRAMUSCULAR | Status: AC
Start: 2019-04-19 — End: 2019-04-19
  Administered 2019-04-19: 17:00:00 4 mg via INTRAVENOUS

## 2019-04-19 MED ORDER — FENTANYL CITRATE (PF) 100 MCG/2ML IJ SOLN
100 MCG/2ML | Freq: Once | INTRAMUSCULAR | Status: AC
Start: 2019-04-19 — End: 2019-04-19
  Administered 2019-04-19: 14:00:00 75 ug via INTRAVENOUS

## 2019-04-19 MED ORDER — SODIUM CHLORIDE 0.9 % IV BOLUS
0.9 % | Freq: Once | INTRAVENOUS | Status: AC
Start: 2019-04-19 — End: 2019-04-19
  Administered 2019-04-19: 14:00:00 1000 mL via INTRAVENOUS

## 2019-04-19 MED ORDER — PANTOPRAZOLE SODIUM 40 MG PO TBEC
40 MG | ORAL_TABLET | Freq: Every day | ORAL | 0 refills | Status: DC
Start: 2019-04-19 — End: 2019-07-19

## 2019-04-19 MED FILL — FENTANYL CITRATE (PF) 100 MCG/2ML IJ SOLN: 100 MCG/2ML | INTRAMUSCULAR | Qty: 2

## 2019-04-19 MED FILL — ONDANSETRON HCL 4 MG/2ML IJ SOLN: 4 MG/2ML | INTRAMUSCULAR | Qty: 2

## 2019-04-19 MED FILL — MORPHINE SULFATE 10 MG/ML IJ SOLN: 10 mg/mL | INTRAMUSCULAR | Qty: 1

## 2019-04-19 NOTE — ED Notes (Signed)
PA notified the pt has been asking for pain medication     Leanord Asal, RN  04/19/19 1140

## 2019-04-19 NOTE — ED Provider Notes (Signed)
[facility]                                                                                                                                        Department of Emergency Medicine   ED  Provider Note  Admit Date/RoomTime: 04/19/2019  8:38 AM  ED Room: OTF/OTF        HPI:  04/19/19,   Time: 8:58 AM EST         Walter Farmer is a 39 y.o. male presenting to the ED for right sided abdominal pain, beginning 3 days ago.  The complaint has been persistent, moderate in severity, and worsened by movement and eating.  Patient presents to the emergency room with severe right-sided abdominal pain. He is 3 weeks status post laparoscopic appendectomy by Dr. Daphine Deutscher.    The patient reports that the surgery went well and he has had absolutely no issues for the past 3 weeks.  He   states that out of the blue on Friday he developed sudden onset of right-sided abdominal pain.  He has pain both in the upper and lower quadrants.  The pain has been constant but getting progressively more severe.   The patient describes it as an intense severe and constant pain.  He states that sometimes feels like it is radiating to his back.  He states that eating has made the pain a little bit worse at times but not at others.  He is having normal bowel movements and denies any watery or bloody stools.  No black stools reported.  The patient denies any nausea or vomiting.  He states that he has been eating just fine all weekend.  He denies any fever or chills.  Denies history of kidney stones.  He does still have his gallbladder.  States that there has been no drainage from the wounds or rashes.  Denies urinary burning, frequency or hematuria.      ROS:     Constitutional: Negative for fever and chills  HENT: Negative for ear pain, sore throat and sinus pressure  Eyes: Negative for pain, discharge and redness  Respiratory:  Negative for shortness of breath, cough and wheezing  Cardiovascular: Negative for CP, edema or palpitations  Gastrointestinal:  See HPI  Genitourinary: See HPI  Musculoskeletal: Negative for back pain and arthralgia  Skin: Negative for rash and wound  Neurological: Negative for weakness and headaches  Hematological: Negative for adenopathy    All other systems reviewed and are negative      -------------------------------- PAST HISTORY ----------------------------------  Past Medical History:  has a past medical history of GERD (gastroesophageal reflux disease), Neuropathy, and PE (pulmonary thromboembolism) (HCC).    Past Surgical History:  has a past surgical history that includes Tonsillectomy; knee surgery (Right); laparoscopic appendectomy (03/24/2019); and laparoscopic appendectomy (N/A, 03/24/2019).    Social History:  reports that he has been smoking. He has  a 10.00 pack-year smoking history. His smokeless tobacco use includes chew. He reports previous drug use. He reports that he does not drink alcohol.    Family History: family history includes COPD in his mother; Diabetes in his father; High Blood Pressure in his mother; High Cholesterol in his mother; Mult Sclerosis in his paternal grandmother; No Known Problems in his paternal grandfather; Obesity in his mother; Other in his brother and mother; Scoliosis in his sister; Stroke in his mother.     The patient's home medications have been reviewed.    Allergies: Patient has no known allergies.    --------------------------------- RESULTS ------------------------------------------  All laboratory and radiology results have been personally reviewed by myself   LABS:  Results for orders placed or performed during the hospital encounter of 04/19/19   CBC Auto Differential   Result Value Ref Range    WBC 7.6 4.5 - 11.5 E9/L    RBC 5.15 3.80 - 5.80 E12/L    Hemoglobin 15.5 12.5 - 16.5 g/dL    Hematocrit 16.1 09.6 - 54.0 %    MCV 90.1 80.0 - 99.9 fL    MCH 30.1 26.0 - 35.0 pg    MCHC 33.4 32.0 - 34.5 %    RDW 13.1 11.5 - 15.0 fL    Platelets 334 130 - 450 E9/L    MPV 9.2 7.0 - 12.0 fL     Neutrophils % 43.2 43.0 - 80.0 %    Immature Granulocytes % 0.3 0.0 - 5.0 %    Lymphocytes % 43.6 (H) 20.0 - 42.0 %    Monocytes % 8.4 2.0 - 12.0 %    Eosinophils % 3.8 0.0 - 6.0 %    Basophils % 0.7 0.0 - 2.0 %    Neutrophils Absolute 3.29 1.80 - 7.30 E9/L    Immature Granulocytes # 0.02 E9/L    Lymphocytes Absolute 3.32 1.50 - 4.00 E9/L    Monocytes Absolute 0.64 0.10 - 0.95 E9/L    Eosinophils Absolute 0.29 0.05 - 0.50 E9/L    Basophils Absolute 0.05 0.00 - 0.20 E9/L   Comprehensive Metabolic Panel   Result Value Ref Range    Sodium 139 132 - 146 mmol/L    Potassium 4.2 3.5 - 5.0 mmol/L    Chloride 103 98 - 107 mmol/L    CO2 26 22 - 29 mmol/L    Anion Gap 10 7 - 16 mmol/L    Glucose 96 74 - 99 mg/dL    BUN 14 6 - 20 mg/dL    CREATININE 1.1 0.7 - 1.2 mg/dL    GFR Non-African American >60 >=60 mL/min/1.73    GFR African American >60     Calcium 9.2 8.6 - 10.2 mg/dL    Total Protein 7.5 6.4 - 8.3 g/dL    Albumin 4.5 3.5 - 5.2 g/dL    Total Bilirubin 0.3 0.0 - 1.2 mg/dL    Alkaline Phosphatase 73 40 - 129 U/L    ALT 21 0 - 40 U/L    AST 19 0 - 39 U/L   Lactic Acid, Plasma   Result Value Ref Range    Lactic Acid 0.9 0.5 - 2.2 mmol/L   Lipase   Result Value Ref Range    Lipase 22 13 - 60 U/L   Urinalysis   Result Value Ref Range    Color, UA Yellow Straw/Yellow    Clarity, UA Clear Clear    Glucose, Ur Negative Negative mg/dL    Bilirubin Urine  Negative Negative    Ketones, Urine Negative Negative mg/dL    Specific Gravity, UA 1.025 1.005 - 1.030    Blood, Urine TRACE (A) Negative    pH, UA 6.0 5.0 - 9.0    Protein, UA Negative Negative mg/dL    Urobilinogen, Urine 0.2 <2.0 E.U./dL    Nitrite, Urine Negative Negative    Leukocyte Esterase, Urine Negative Negative   Microscopic Urinalysis   Result Value Ref Range    WBC, UA NONE 0 - 5 /HPF    RBC, UA 0-1 0 - 2 /HPF    Epithelial Cells, UA NONE SEEN /HPF    Bacteria, UA NONE SEEN None Seen /HPF       RADIOLOGY:  Interpreted by Radiologist.  US GALLBLADDER RUQ   Final  Result   Contracted gallbladder.   Remainder normal.      CT ABDOMEN PELVIS W IV CONTRAST Additional Contrast? None   Final Result   Post appendectomy, there is no sign of residual inflammation or fluid at the   of and deck knee site.   Mild, nonspecific rectal wall thickening,?  Mild proctitis.      CTA CHEST W CONTRAST   Final Result   No signs of pulmonary embolism.   Minimal right middle lobe consolidation, suspect localized   pneumonitis/bronchiolitis.   Diffuse mild airway disease, asthma versus acute bronchiolitis versus chronic   bronchitis.          ----------------- NURSING NOTES AND VITALS REVIEWED ---------------   The nursing notes within the ED encounter and vital signs as below have been reviewed.   BP (!) 150/98   Pulse 52   Temp 97.5 F (36.4 C) (Temporal)   Resp 16   Ht 5\' 8"  (1.727 m)   Wt 220 lb (99.8 kg)   SpO2 98%   BMI 33.45 kg/m   Oxygen Saturation Interpretation: Normal      --------------------------------PHYSICAL EXAM------------------------------------      Constitutional/General: Alert and oriented x3, moderate distress.    Head: NC/AT  Eyes: PERRL, EOMI  Mouth: Oropharynx clear, handling secretions, no trismus  Neck: Supple, full ROM, no meningeal signs  Pulmonary: Lungs clear to auscultation bilaterally, no wheezes, rales, or rhonchi. Not in respiratory distress  Cardiovascular:  Regular rate and rhythm, no murmurs, gallops, or rubs. 2+ distal pulses  Abdomen: Soft, + BS.  Wounds intact/dry.   No rash or erythema.   Moderately tender right upper/lower quadrants with guarding.   No palpable masses or organs.   No rigidity or rebound.     Extremities: Moves all extremities x 4. Warm and well perfused  Skin: warm and dry without rash  Neurologic: GCS 15,  Intact.  No focal deficits  Psych: Normal Affect      ------------------------ ED COURSE/MEDICAL DECISION MAKING----------------------  Medications   0.9 % sodium chloride bolus (0 mLs Intravenous Stopped 04/19/19 1059)    fentaNYL (SUBLIMAZE) injection 75 mcg (75 mcg Intravenous Given 04/19/19 0915)   iopamidol (ISOVUE-370) 76 % injection 75 mL (75 mLs Intravenous Given 04/19/19 1043)   morphine (PF) injection 6 mg (6 mg Intravenous Given 04/19/19 1201)   ondansetron (ZOFRAN) injection 4 mg (4 mg Intravenous Given 04/19/19 1201)         Medical Decision Making:    The patient labs are normal.   He was given fluids and meds for pain here.   CT abd/pelvis normal.  No inflammatory changes seen at site of surgery or in bowels.  Discussed results with Dr. Daphine Deutscher.  He states from his standpoint patient can be discharged home.      Wife is now at [facility] stating he has had significant burping/belching and heartburn.   She feels pain is worse after eating.  RUQ [place] is unremarkable.  Plan discharge home with Percocet and Protonix.  Advised to call Dr. Daphine Deutscher for follow-up.    ? Need for EGD or HIDA scan.   Wife and patient aware and agreeable to plan.          Counseling:   The emergency provider has spoken with the patient and discussed today's results, in addition to providing specific details for the plan of care and counseling regarding the diagnosis and prognosis.  Questions are answered at this time and they are agreeable with the plan.      ------------------------ IMPRESSION AND DISPOSITION -------------------------------    IMPRESSION  1. Right lower quadrant abdominal pain        DISPOSITION  Disposition: Discharge to home  Patient condition is stable                   Eartha Inch, PA-C  04/19/19 1427

## 2019-04-19 NOTE — Discharge Instructions (Signed)
Call Dr. Daphine Deutscher for follow-up

## 2019-04-19 NOTE — ED Notes (Signed)
The patient shows the area of pain to be the RUQ radiating straight to the back. He denies nausea or vomiting.     Leanord Asal, RN  04/19/19 [phone]

## 2019-07-19 ENCOUNTER — Ambulatory Visit: Admit: 2019-07-19 | Discharge: 2019-07-19 | Payer: PRIVATE HEALTH INSURANCE | Attending: Surgery

## 2019-07-19 DIAGNOSIS — R1011 Right upper quadrant pain: Secondary | ICD-10-CM

## 2019-07-19 NOTE — Telephone Encounter (Signed)
Prior Authorization Form:      DEMOGRAPHICS:                     Patient Name:  Walter Farmer  Patient DOB:  11-14-1980            Insurance:  Payor: [employer] / Plan: [employer] [place] MEDICAID / Product Type: *No Product type* /   Insurance ID Number:    Payor/Plan Subscr DOB Sex Relation Sub. Ins. ID Effective Group Num   1. [employer] - * Gorter,Aum A [birth year] Male Self [id number] [birth year] [id number]                                   [address]         DIAGNOSIS & PROCEDURE:                       Procedure/Operation: EGD           CPT Code: 56314    Diagnosis:  RUQ pain    ICD10 Code: R10.11    Location:  [facility]    Surgeon:  Charmian Muff INFORMATION:                          Date: 07/27/2019    Time:               Anesthesia:                                                         Status:  Outpatient        Special Comments:           Electronically signed by Lawerance Bach, MA on 07/19/2019 at 2:28 PM

## 2019-07-19 NOTE — Telephone Encounter (Signed)
Patient provided with surgery instructions during office visit.    Patient scheduled for follow up visit with Dr. Daphine Deutscher on 08/05/2019.    Surgery scheduling form faxed and confirmation received.    Electronically signed by Lawerance Bach, MA on 07/19/2019 at 2:29 PM

## 2019-07-21 ENCOUNTER — Encounter

## 2019-07-21 ENCOUNTER — Inpatient Hospital Stay: Payer: PRIVATE HEALTH INSURANCE

## 2019-07-21 DIAGNOSIS — Z01812 Encounter for preprocedural laboratory examination: Secondary | ICD-10-CM

## 2019-07-21 LAB — COVID-19 AMBULATORY

## 2019-07-23 LAB — COVID-19: SARS-CoV-2, PCR: NOT DETECTED

## 2019-07-23 NOTE — Progress Notes (Signed)
[facility]                                                                                                                    PRE OP INSTRUCTIONS FOR  Walter Farmer        Date: 07/23/2019    Date of surgery: 07/27/19   Arrival Time: Hospital will call you between 5pm and 7pm with your final arrival time for surgery    1. Do not eat or drink anything after midnight prior to surgery. This includes no water, chewing gum, mints or ice chips.    2. Take the following medications with a small sip of water on the morning of Surgery: none     3. Diabetics may take evening dose of insulin but none after midnight.  If you feel symptomatic or low blood sugar morning of surgery drink 1-2 ounces of apple juice only.    4. Aspirin, Ibuprofen, Advil, Naproxen, Vitamin E and other Anti-inflammatory products should be stopped  before surgery  as directed by your physician.  Take Tylenol only unless instructed otherwise by your surgeon.    5. Check with your Doctor regarding stopping Plavix, Coumadin, Lovenox, Eliquis, Effient, or other blood thinners.    6. Do not smoke,use illicit drugs and do not drink any alcoholic beverages 24 hours prior to surgery.    7. You may brush your teeth the morning of surgery.  DO NOT SWALLOW WATER    8. You MUST make arrangements for a responsible adult to take you home after your surgery. You will not be allowed to leave alone or drive yourself home.  It is strongly suggested someone stay with you the first 24 hrs. Your surgery will be cancelled if you do not have a ride home.    9. PEDIATRIC PATIENTS ONLY:  A parent/legal guardian must accompany a child scheduled for surgery and plan to stay at the hospital until the child is discharged.  Please do not bring other children with you.    10. Please wear simple, loose fitting clothing to the hospital.  Do not bring valuables (money, credit cards, checkbooks, etc.) Do not wear any makeup (including no eye makeup) or nail polish  on your fingers or toes.    11. DO NOT wear any jewelry or piercings on day of surgery.  All body piercing jewelry must be removed.    12. Shower the night before surgery with _x__Antibacterial soap /SAGE WIPES________    13. TOTAL JOINT REPLACEMENT/HYSTERECTOMY PATIENTS ONLY---Remember to bring Blood Bank bracelet to the hospital on the day of surgery.    14. If you have a Living Will and Durable Power of Attorney for Healthcare, please bring in a copy.    15. If appropriate bring crutches, inspirex, WALKER, CANE etc...    16. Notify your Surgeon if you develop any illness between now and surgery time, cough, cold, fever, sore throat, nausea, vomiting, etc.  Please notify your surgeon if you experience dizziness,  shortness of breath or blurred vision between now & the time of your surgery.    17. If you have ___dentures, they will be removed before going to the OR; we will provide you a container. If you wear ___contact lenses or ___glasses, they will be removed; please bring a case for them.    18. To provide excellent care visitors will be limited to 1 in the room at any given time.    19. Please bring picture ID and insurance card.    20. Sleep apnea patients need to bring CPAP AND SETTINGS to hospital on day of surgery.                                                                                         21. During flu season no children under the age of 31 are permitted in the hospital for the safety of all patients.     22. Other                  Please call AMBULATORY CARE if you have any further questions.   Walter Farmer     [place]

## 2019-07-27 ENCOUNTER — Inpatient Hospital Stay: Payer: PRIVATE HEALTH INSURANCE

## 2019-07-27 MED ORDER — PROPOFOL 200 MG/20ML IV EMUL
20020 MG/20ML | INTRAVENOUS | Status: DC | PRN
Start: 2019-07-27 — End: 2019-07-27

## 2019-07-27 MED ORDER — LACTATED RINGERS IV SOLN
INTRAVENOUS | Status: DC
Start: 2019-07-27 — End: 2019-07-27
  Administered 2019-07-27: 12:00:00 via INTRAVENOUS

## 2019-07-27 MED ORDER — PROPOFOL 200 MG/20ML IV EMUL
200 | INTRAVENOUS | Status: AC
Start: 2019-07-27 — End: 2019-07-27

## 2019-07-27 MED ORDER — OMEPRAZOLE 20 MG PO CPDR
20 MG | ORAL_CAPSULE | Freq: Two times a day (BID) | ORAL | 3 refills | Status: AC
Start: 2019-07-27 — End: ?

## 2019-07-27 MED ORDER — PROPOFOL 200 MG/20ML IV EMUL
200 MG/20ML | INTRAVENOUS | Status: DC | PRN
Start: 2019-07-27 — End: 2019-07-27
  Administered 2019-07-27: 13:00:00 180 via INTRAVENOUS

## 2019-07-27 MED FILL — DIPRIVAN 200 MG/20ML IV EMUL: 200 MG/20ML | INTRAVENOUS | Qty: 40

## 2019-07-27 NOTE — Discharge Instructions (Signed)
Your stomach had some inflammation the prilosec prescription I gave you will help heal this.         Upper GI Endoscopy: What to Expect at Home  Your Recovery  You had an upper GI endoscopy. Your doctor used a thin, lighted tube that bends to look at the inside of your esophagus, your stomach, and the first part of the small intestine, called the duodenum.  After you have an endoscopy, you will stay at the hospital or clinic for 1 to 2 hours. This will allow the medicine to wear off. You will be able to go home after your doctor or nurse checks to make sure that you're not having any problems.  You may have to stay overnight if you had treatment during the test. You may have a sore throat for a day or two after the test.  This care sheet gives you a general idea about what to expect after the test.  How can you care for yourself at home?  Activity    Rest as much as you need to after you go home.   You should be able to go back to your usual activities the day after the test.  Diet    Follow your doctor's directions for eating after the test.   Drink plenty of fluids (unless your doctor has told you not to).  Medications    If you have a sore throat the day after the test, use an over-the-counter spray to numb your throat.  Follow-up care is a key part of your treatment and safety. Be sure to make and go to all appointments, and call your doctor if you are having problems. It's also a good idea to know your test results and keep a list of the medicines you take.  When should you call for help?   Call 911 anytime you think you may need emergency care. For example, call if:    You passed out (lost consciousness).     You have trouble breathing.     You pass maroon or bloody stools.   Call your doctor now or seek immediate medical care if:    You have pain that does not get better after your take pain medicine.     You have new or worse belly pain.     You have blood in your stools.     You are sick  to your stomach and cannot keep fluids down.     You have a fever.     You cannot pass stools or gas.   Watch closely for changes in your health, and be sure to contact your doctor if:    Your throat still hurts after a day or two.     You do not get better as expected.   Where can you learn more?  Go to [email or web address] and sign in to your MyChart account. Enter [phone] in the Search Health Information box to learn more about "Upper GI Endoscopy: What to Expect at Home."     If you do not have an account, please click on the "Sign Up Now" link.  Current as of: April 15, 2020Content Version: 12.8   2006-2021 [employer].   Care instructions adapted under license by [facility]. If you have questions about a medical condition or this instruction, always ask your healthcare professional. [employer] disclaims any warranty or liability for your use of this information.

## 2019-07-27 NOTE — Op Note (Signed)
SURGEON: Haskell Flirt  M.D.     PREOPERATIVE DIAGNOSES:  ruq pain    POSTOPERATIVE DIAGNOSES: esophagitis and duodenitis, mild gastritis     OPERATION: Esophago-gastro-duodenoscopy with biopsy    BLOOD LOSS:    ANESTHESIA: LMAC    COMPLICATIONS: None.     OPERATIONS: The patient was placed on the table in left lateral decubitus position and sedated. Bite block was placed. A lubricated scope was easily passed into the upper esophagus which looked normal. The distal esophagus looked inflamed and biopsy was taken. The scope was passed into the stomach and retroflexed. There was no hiatal hernia. The scope was passed down toward the pylorus. The antral mucosa all looked inflamed. Biopsy was taken to check for H. pylori. The scope was then passed through the pylorus into the duodenal bulb which looked inflamed, then around to the distal duodenum which looked normal, and the scope was then withdrawn. The GE junction was at 39 cm from the teeth. The patient tolerated the procedure well.     Physician Signature: Electronically signed by Dr. Daphine Deutscher

## 2019-07-27 NOTE — H&P (Signed)
General Surgery History and Physical  ??  Patient's Name/Date of Birth: Walter Farmer / May 12, 1980  ??  Date: 07/27/2019    ??  Surgeon: Alphonsus Sias M.D.  ??  PCP: No primary care provider on file.     Chief Complaint: right upper quadrant pain  ??  HPI:   Anne A Sankey is a 39 y.o. male who presents for evaluation of right upper quadrant pain. Timing is intermittent, radiation to right side, alleviated by nothing but time and started weeks ago and severity is 3=9/10  ??  ??  Past Medical History        Past Medical History:   Diagnosis Date   ??? GERD (gastroesophageal reflux disease) ??   ??? Neuropathy ??   ?? right shouolder to fingers-due to a pinched nerve   ??? PE (pulmonary thromboembolism) ([place]) ??      ??  ??  Past Surgical History         Past Surgical History:   Procedure Laterality Date   ??? KNEE SURGERY Right ??   ??? LAPAROSCOPIC APPENDECTOMY ?? 03/24/2019   ??? LAPAROSCOPIC APPENDECTOMY N/A 03/24/2019   ?? APPENDECTOMY LAPAROSCOPIC performed by Kristen Cardinal, MD at [facility]   ??? TONSILLECTOMY ?? ??      ??  ??  Current Facility-Administered Medications   No current outpatient medications on file.   ??  No current facility-administered medications for this visit.      ??  ??  No Known Allergies  ??  The patient has a family history that is negative for severe cardiovascular or respiratory issues, negative for reaction to anesthesia.  ??  Social History      ??        Socioeconomic History   ??? Marital status: Married   ?? ?? Spouse name: Not on file   ??? Number of children: Not on file   ??? Years of education: Not on file   ??? Highest education level: Not on file   Occupational History   ??? Not on file   Tobacco Use   ??? Smoking status: Current Every Day Smoker   ?? ?? Packs/day: 1.00   ?? ?? Years: 10.00   ?? ?? Pack years: 10.00   ??? Smokeless tobacco: Current User   ?? ?? Types: Chew   Substance and Sexual Activity   ??? Alcohol use: No   ?? ?? Comment: very rare   ??? Drug use: Not Currently   ??? Sexual activity: Yes   Other Topics Concern   ??? Not on file    Social History Narrative   ??? Not on file   ??  Social Determinants of Health   ??      Financial Resource Strain:    ??? Difficulty of Paying Living Expenses:    Food Insecurity:    ??? Worried About Charity fundraiser in the Last Year:    ??? Arboriculturist in the Last Year:    Transportation Needs:    ??? Film/video editor (Medical):    ??? Lack of Transportation (Non-Medical):    Physical Activity:    ??? Days of Exercise per Week:    ??? Minutes of Exercise per Session:    Stress:    ??? Feeling of Stress :    Social Connections:    ??? Frequency of Communication with Friends and Family:    ??? Frequency of Social Gatherings with Friends and Family:    ???  Attends Religious Services:    ??? Active Member of Clubs or Organizations:    ??? Attends Banker Meetings:    ??? Marital Status:    Intimate Programme researcher, broadcasting/film/video Violence:    ??? Fear of Current or Ex-Partner:    ??? Emotionally Abused:    ??? Physically Abused:    ??? Sexually Abused:       ??  ??  ??  ??  Review of Systems  Review of Systems -  General ROS: negative for - chills, fatigue or malaise  ENT ROS: negative for - hearing change, nasal congestion or nasal discharge  Allergy and Immunology ROS: negative for - hives, itchy/watery eyes or nasal congestion  Hematological and Lymphatic ROS: negative for - blood clots, blood transfusions, bruising or fatigue  Endocrine ROS: negative for - malaise/lethargy, mood swings, palpitations or polydipsia/polyuria  Respiratory ROS: negative for - sputum changes, stridor, tachypnea or wheezing  Cardiovascular ROS: negative for - irregular heartbeat, loss of consciousness, murmur or orthopnea  Gastrointestinal ROS: negative for - constipation, diarrhea, gas/bloating, heartburn or hematemesis  Genito-Urinary ROS: negative for -  genital discharge, genital ulcers or hematuria  Musculoskeletal ROS: negative for - gait disturbance, muscle pain or muscular weakness  ??  Physical exam:   BP 123/64    Pulse 57    Temp 97.5 ??F (36.4 ??C) (Temporal)    Resp  16    Ht 5\' 8"  (1.727 m)    Wt 221 lb 1.6 oz (100.3 kg)    SpO2 98%    BMI 33.62 kg/m??     General appearance:  NAD  Head: NCAT, PERRLA, EOMI, red conjunctiva  Neck: supple, no masses  Lungs: CTAB, equal chest rise bilateral  Heart: Reg rate  Abdomen: soft, nontender, nondistended  Skin; no lesions  Gu: no cva tenderness  Extremities: extremities normal, atraumatic, no cyanosis or edema  ??  ??  Radiology:  CT abdomen/pelvis: no acute  ??  Assessment:  39 y.o. male with right sided abdominal pain  ??  Plan:  Will do EGD and hida with cck to assess  40, MD

## 2019-07-29 ENCOUNTER — Inpatient Hospital Stay: Admit: 2019-07-29 | Payer: PRIVATE HEALTH INSURANCE

## 2019-07-29 ENCOUNTER — Encounter

## 2019-07-29 ENCOUNTER — Inpatient Hospital Stay: Payer: PRIVATE HEALTH INSURANCE

## 2019-07-29 DIAGNOSIS — Z01818 Encounter for other preprocedural examination: Secondary | ICD-10-CM

## 2019-07-29 DIAGNOSIS — R1011 Right upper quadrant pain: Secondary | ICD-10-CM

## 2019-07-29 MED ORDER — TECHNETIUM TC 99M MEBROFENIN
Freq: Once | Status: AC | PRN
Start: 2019-07-29 — End: 2019-07-29
  Administered 2019-07-29: 16:00:00 6 via INTRAVENOUS

## 2019-07-29 NOTE — Telephone Encounter (Signed)
Patient provided with surgery instructions during office visit.    Patient scheduled for follow up visit with Dr. Daphine Deutscher on 08/16/2019.    Surgery scheduling form faxed and confirmation received.    Electronically signed by Lawerance Bach, MA on 07/29/2019 at 1:05 PM

## 2019-07-29 NOTE — Telephone Encounter (Signed)
Prior Authorization Form:      DEMOGRAPHICS:                     Patient Name:  Walter Farmer  Patient DOB:  1980/10/31            Insurance:  Payor: [employer] / Plan: [employer] [place] MEDICAID / Product Type: *No Product type* /   Insurance ID Number:    Payor/Plan Subscr DOB Sex Relation Sub. Ins. ID Effective Group Num   1. [employer] - * Kossman,Toddy A 1980/11/11 Male Self [id number] 02/25/[birth year] [employer]                                   [address]         DIAGNOSIS & PROCEDURE:                       Procedure/Operation: lap chole           CPT Code: 74128    Diagnosis:  RUQ pain    ICD10 Code: R10.11    Location:  [facility]    Surgeon:  Charmian Muff INFORMATION:                          Date: 08/02/2019    Time:               Anesthesia:                                                         Status:  Outpatient        Special Comments:           Electronically signed by Lawerance Bach, MA on 07/29/2019 at 1:03 PM

## 2019-07-31 LAB — COVID-19 AMBULATORY: SARS-CoV-2: NOT DETECTED

## 2019-08-02 ENCOUNTER — Observation Stay
Admit: 2019-08-02 | Discharge: 2019-08-02 | Disposition: A | Discharge status: Home / Self Care | Payer: PRIVATE HEALTH INSURANCE | Source: Ambulatory Visit | Admitting: Surgery

## 2019-08-02 DIAGNOSIS — K8012 Calculus of gallbladder with acute and chronic cholecystitis without obstruction: Secondary | ICD-10-CM

## 2019-08-02 LAB — CBC WITH AUTO DIFFERENTIAL
Basophils %: 0.6 % (ref 0.0–2.0)
Basophils Absolute: 0.06 E9/L (ref 0.00–0.20)
Eosinophils %: 4.8 % (ref 0.0–6.0)
Eosinophils Absolute: 0.47 E9/L (ref 0.05–0.50)
Hematocrit: 44.3 % (ref 37.0–54.0)
Hemoglobin: 14.5 g/dL (ref 12.5–16.5)
Immature Granulocytes #: 0.02 E9/L
Immature Granulocytes %: 0.2 % (ref 0.0–5.0)
Lymphocytes %: 35.3 % (ref 20.0–42.0)
Lymphocytes Absolute: 3.42 E9/L (ref 1.50–4.00)
MCH: 30.5 pg (ref 26.0–35.0)
MCHC: 32.7 % (ref 32.0–34.5)
MCV: 93.3 fL (ref 80.0–99.9)
MPV: 9 fL (ref 7.0–12.0)
Monocytes %: 9.9 % (ref 2.0–12.0)
Monocytes Absolute: 0.96 E9/L — ABNORMAL HIGH (ref 0.10–0.95)
Neutrophils %: 49.2 % (ref 43.0–80.0)
Neutrophils Absolute: 4.77 E9/L (ref 1.80–7.30)
Platelets: 298 E9/L (ref 130–450)
RBC: 4.75 E12/L (ref 3.80–5.80)
RDW: 13.4 fL (ref 11.5–15.0)
WBC: 9.7 E9/L (ref 4.5–11.5)

## 2019-08-02 LAB — COMPREHENSIVE METABOLIC PANEL W/ REFLEX TO MG FOR LOW K
ALT: 20 U/L (ref 0–40)
AST: 18 U/L (ref 0–39)
Albumin: 3.9 g/dL (ref 3.5–5.2)
Alkaline Phosphatase: 95 U/L (ref 40–129)
Anion Gap: 8 mmol/L (ref 7–16)
BUN: 17 mg/dL (ref 6–20)
CO2: 26 mmol/L (ref 22–29)
Calcium: 8.8 mg/dL (ref 8.6–10.2)
Chloride: 109 mmol/L — ABNORMAL HIGH (ref 98–107)
Creatinine: 1.2 mg/dL (ref 0.7–1.2)
GFR African American: >60
GFR Non-African American: >60 mL/min/{1.73_m2} (ref >=60)
Glucose: 103 mg/dL — ABNORMAL HIGH (ref 74–99)
Potassium reflex Magnesium: 4.5 mmol/L (ref 3.5–5.0)
Sodium: 143 mmol/L (ref 132–146)
Total Bilirubin: <0.2 mg/dL (ref 0.0–1.2)
Total Protein: 6.6 g/dL (ref 6.4–8.3)

## 2019-08-02 MED ORDER — FENTANYL CITRATE (PF) 250 MCG/5ML IJ SOLN
250 MCG/5ML | INTRAMUSCULAR | Status: DC | PRN
Start: 2019-08-02 — End: 2019-08-02
  Administered 2019-08-02 (×2): 100 via INTRAVENOUS
  Administered 2019-08-02: 16:00:00 50 via INTRAVENOUS

## 2019-08-02 MED ORDER — SODIUM CHLORIDE 0.9 % IV SOLN
0.9 | INTRAVENOUS | Status: DC | PRN
Start: 2019-08-02 — End: 2019-08-02

## 2019-08-02 MED ORDER — HYDROMORPHONE HCL 1 MG/ML IJ SOLN
1 MG/ML | INTRAMUSCULAR | Status: AC
Start: 2019-08-02 — End: 2019-08-02
  Administered 2019-08-02: 16:00:00 0.5 via INTRAVENOUS

## 2019-08-02 MED ORDER — MORPHINE SULFATE 4 MG/ML IJ SOLN
4 MG/ML | Freq: Once | INTRAMUSCULAR | Status: DC
Start: 2019-08-02 — End: 2019-08-02

## 2019-08-02 MED ORDER — ROCURONIUM BROMIDE 100 MG/10ML IV SOLN
100 MG/10ML | INTRAVENOUS | Status: DC | PRN
Start: 2019-08-02 — End: 2019-08-02
  Administered 2019-08-02: 15:00:00 40 via INTRAVENOUS

## 2019-08-02 MED ORDER — FENTANYL CITRATE (PF) 100 MCG/2ML IJ SOLN
100 MCG/2ML | INTRAMUSCULAR | Status: DC | PRN
Start: 2019-08-02 — End: 2019-08-02

## 2019-08-02 MED ORDER — OXYCODONE-ACETAMINOPHEN 5-325 MG PO TABS
5-325 MG | ORAL | Status: DC | PRN
Start: 2019-08-02 — End: 2019-08-02

## 2019-08-02 MED ORDER — HYDROMORPHONE HCL 1 MG/ML IJ SOLN
1 MG/ML | INTRAMUSCULAR | Status: DC | PRN
Start: 2019-08-02 — End: 2019-08-02

## 2019-08-02 MED ORDER — LIDOCAINE HCL 1 % IJ SOLN
1 % | INTRAMUSCULAR | Status: DC | PRN
Start: 2019-08-02 — End: 2019-08-02
  Administered 2019-08-02: 15:00:00 50 via INTRAVENOUS

## 2019-08-02 MED ORDER — NORMAL SALINE FLUSH 0.9 % IV SOLN
0.9 | Freq: Two times a day (BID) | INTRAVENOUS | Status: DC
Start: 2019-08-02 — End: 2019-08-02
  Administered 2019-08-02: 13:00:00 10 mL via INTRAVENOUS

## 2019-08-02 MED ORDER — OXYCODONE-ACETAMINOPHEN 5-325 MG PO TABS
5-325 MG | ORAL_TABLET | Freq: Four times a day (QID) | ORAL | 0 refills | Status: AC | PRN
Start: 2019-08-02 — End: 2019-08-07

## 2019-08-02 MED ORDER — ROCURONIUM BROMIDE 100 MG/10ML IV SOLN
100 | INTRAVENOUS | Status: AC
Start: 2019-08-02 — End: 2019-08-02

## 2019-08-02 MED ORDER — GLYCOPYRROLATE 1 MG/5ML IV SOSY
1 | INTRAVENOUS | Status: AC
Start: 2019-08-02 — End: 2019-08-02

## 2019-08-02 MED ORDER — BUPIVACAINE-EPINEPHRINE (PF) 0.25% -1:200000 IJ SOLN
INTRAMUSCULAR | Status: AC
Start: 2019-08-02 — End: 2019-08-02

## 2019-08-02 MED ORDER — MEPERIDINE HCL 25 MG/ML IJ SOLN
25 MG/ML | INTRAMUSCULAR | Status: DC | PRN
Start: 2019-08-02 — End: 2019-08-02
  Administered 2019-08-02: 17:00:00 12.5 mg via INTRAVENOUS

## 2019-08-02 MED ORDER — ONDANSETRON 4 MG PO TBDP
4 MG | Freq: Three times a day (TID) | ORAL | Status: DC | PRN
Start: 2019-08-02 — End: 2019-08-02

## 2019-08-02 MED ORDER — NORMAL SALINE FLUSH 0.9 % IV SOLN
0.9 | INTRAVENOUS | Status: DC | PRN
Start: 2019-08-02 — End: 2019-08-02

## 2019-08-02 MED ORDER — DEXAMETHASONE SOD PHOSPHATE PF 10 MG/ML IJ SOLN
10 | INTRAMUSCULAR | Status: AC
Start: 2019-08-02 — End: 2019-08-02

## 2019-08-02 MED ORDER — BUPIVACAINE-EPINEPHRINE (PF) 0.25% -1:200000 IJ SOLN
INTRAMUSCULAR | Status: DC | PRN
Start: 2019-08-02 — End: 2019-08-02
  Administered 2019-08-02: 15:00:00 30 via INTRADERMAL

## 2019-08-02 MED ORDER — SUGAMMADEX SODIUM 500 MG/5ML IV SOLN
500 | INTRAVENOUS | Status: DC | PRN
Start: 2019-08-02 — End: 2019-08-02
  Administered 2019-08-02: 16:00:00 400 via INTRAVENOUS

## 2019-08-02 MED ORDER — LACTATED RINGERS IV SOLN
INTRAVENOUS | Status: DC | PRN
Start: 2019-08-02 — End: 2019-08-02
  Administered 2019-08-02 (×2): via INTRAVENOUS

## 2019-08-02 MED ORDER — NEOSTIGMINE METHYLSULFATE 3 MG/3ML IV SOSY
3 | INTRAVENOUS | Status: AC
Start: 2019-08-02 — End: 2019-08-02

## 2019-08-02 MED ORDER — SODIUM CHLORIDE 0.9 % IV SOLN
0.9 | INTRAVENOUS | Status: DC
Start: 2019-08-02 — End: 2019-08-02
  Administered 2019-08-02: 13:00:00 via INTRAVENOUS

## 2019-08-02 MED ORDER — PROPOFOL 200 MG/20ML IV EMUL
200 | INTRAVENOUS | Status: AC
Start: 2019-08-02 — End: 2019-08-02

## 2019-08-02 MED ORDER — MIDAZOLAM HCL 2 MG/2ML IJ SOLN
2 | INTRAMUSCULAR | Status: AC
Start: 2019-08-02 — End: 2019-08-02

## 2019-08-02 MED ORDER — DEXAMETHASONE SOD PHOSPHATE PF 10 MG/ML IJ SOLN
10 MG/ML | INTRAMUSCULAR | Status: DC | PRN
Start: 2019-08-02 — End: 2019-08-02
  Administered 2019-08-02: 15:00:00 10 via INTRAVENOUS

## 2019-08-02 MED ORDER — METRONIDAZOLE IN NACL 5-0.79 MG/ML-% IV SOLN
5 | Freq: Three times a day (TID) | INTRAVENOUS | Status: DC
Start: 2019-08-02 — End: 2019-08-02
  Administered 2019-08-02: 13:00:00 500 mg via INTRAVENOUS

## 2019-08-02 MED ORDER — FENTANYL CITRATE (PF) 100 MCG/2ML IJ SOLN
100 MCG/2ML | Freq: Once | INTRAMUSCULAR | Status: AC
Start: 2019-08-02 — End: 2019-08-02
  Administered 2019-08-02: 12:00:00 100 ug via INTRAVENOUS

## 2019-08-02 MED ORDER — LIDOCAINE HCL 1 % IJ SOLN
1 | INTRAMUSCULAR | Status: AC
Start: 2019-08-02 — End: 2019-08-02

## 2019-08-02 MED ORDER — SUGAMMADEX SODIUM 500 MG/5ML IV SOLN
500 | INTRAVENOUS | Status: AC
Start: 2019-08-02 — End: 2019-08-02

## 2019-08-02 MED ORDER — ONDANSETRON HCL 4 MG/2ML IJ SOLN
4 MG/2ML | Freq: Four times a day (QID) | INTRAMUSCULAR | Status: DC | PRN
Start: 2019-08-02 — End: 2019-08-02

## 2019-08-02 MED ORDER — FENTANYL CITRATE (PF) 250 MCG/5ML IJ SOLN
250 | INTRAMUSCULAR | Status: AC
Start: 2019-08-02 — End: 2019-08-02

## 2019-08-02 MED ORDER — PROPOFOL 200 MG/20ML IV EMUL
200 | INTRAVENOUS | Status: DC | PRN
Start: 2019-08-02 — End: 2019-08-02
  Administered 2019-08-02: 15:00:00 200 via INTRAVENOUS

## 2019-08-02 MED ORDER — CEFTRIAXONE SODIUM 1 G IJ SOLR
1 g | Freq: Every day | INTRAMUSCULAR | Status: DC
Start: 2019-08-02 — End: 2019-08-02
  Administered 2019-08-02: 13:00:00 1000 mg via INTRAVENOUS

## 2019-08-02 MED ORDER — ONDANSETRON HCL 4 MG/2ML IJ SOLN
4 | INTRAMUSCULAR | Status: AC
Start: 2019-08-02 — End: 2019-08-02

## 2019-08-02 MED ORDER — ONDANSETRON HCL 4 MG/2ML IJ SOLN
4 MG/2ML | INTRAMUSCULAR | Status: DC | PRN
Start: 2019-08-02 — End: 2019-08-02
  Administered 2019-08-02: 16:00:00 4 via INTRAVENOUS

## 2019-08-02 MED ORDER — ENOXAPARIN SODIUM 40 MG/0.4ML SC SOLN
40 | Freq: Every day | SUBCUTANEOUS | Status: DC
Start: 2019-08-02 — End: 2019-08-02

## 2019-08-02 MED ORDER — SODIUM CHLORIDE 0.9 % IV SOLN
0.9 | INTRAVENOUS | Status: DC
Start: 2019-08-02 — End: 2019-08-02
  Administered 2019-08-02: 12:00:00 1000 mL via INTRAVENOUS

## 2019-08-02 MED ORDER — MEPERIDINE HCL 25 MG/ML IJ SOLN
25 MG/ML | INTRAMUSCULAR | Status: AC
Start: 2019-08-02 — End: 2019-08-02
  Administered 2019-08-02: 17:00:00 12.5 via INTRAVENOUS

## 2019-08-02 MED ORDER — MORPHINE SULFATE 4 MG/ML IJ SOLN
4 MG/ML | INTRAMUSCULAR | Status: DC | PRN
Start: 2019-08-02 — End: 2019-08-02
  Administered 2019-08-02: 13:00:00 4 mg via INTRAVENOUS

## 2019-08-02 MED FILL — MIDAZOLAM HCL 2 MG/2ML IJ SOLN: 2 mg/mL | INTRAMUSCULAR | Qty: 2

## 2019-08-02 MED FILL — DIPRIVAN 200 MG/20ML IV EMUL: 200 MG/20ML | INTRAVENOUS | Qty: 20

## 2019-08-02 MED FILL — HYDROMORPHONE HCL 1 MG/ML IJ SOLN: 1 mg/mL | INTRAMUSCULAR | Qty: 0.5

## 2019-08-02 MED FILL — FENTANYL CITRATE (PF) 100 MCG/2ML IJ SOLN: 100 MCG/2ML | INTRAMUSCULAR | Qty: 2

## 2019-08-02 MED FILL — FENTANYL CITRATE (PF) 250 MCG/5ML IJ SOLN: 250 MCG/5ML | INTRAMUSCULAR | Qty: 5

## 2019-08-02 MED FILL — MORPHINE SULFATE 4 MG/ML IJ SOLN: 4 mg/mL | INTRAMUSCULAR | Qty: 1

## 2019-08-02 MED FILL — ONDANSETRON HCL 4 MG/2ML IJ SOLN: 4 MG/2ML | INTRAMUSCULAR | Qty: 2

## 2019-08-02 MED FILL — DEXAMETHASONE SOD PHOSPHATE PF 10 MG/ML IJ SOLN: 10 mg/mL | INTRAMUSCULAR | Qty: 1

## 2019-08-02 MED FILL — CEFTRIAXONE SODIUM 1 G IJ SOLR: 1 g | INTRAMUSCULAR | Qty: 1000

## 2019-08-02 MED FILL — NEOSTIGMINE METHYLSULFATE 3 MG/3ML IV SOSY: 3 mg/mL | INTRAVENOUS | Qty: 3

## 2019-08-02 MED FILL — LIDOCAINE HCL 1 % IJ SOLN: 1 % | INTRAMUSCULAR | Qty: 10

## 2019-08-02 MED FILL — DEMEROL 25 MG/ML IJ SOLN: 25 mg/mL | INTRAMUSCULAR | Qty: 1

## 2019-08-02 MED FILL — GLYCOPYRROLATE 1 MG/5ML IV SOSY: 1 MG/5ML | INTRAVENOUS | Qty: 5

## 2019-08-02 MED FILL — BUPIVACAINE-EPINEPHRINE (PF) 0.25% -1:200000 IJ SOLN: INTRAMUSCULAR | Qty: 30

## 2019-08-02 MED FILL — METRONIDAZOLE IN NACL 5-0.79 MG/ML-% IV SOLN: 5 mg/mL | INTRAVENOUS | Qty: 100

## 2019-08-02 MED FILL — ROCURONIUM BROMIDE 100 MG/10ML IV SOLN: 100 MG/10ML | INTRAVENOUS | Qty: 10

## 2019-08-02 MED FILL — BRIDION 500 MG/5ML IV SOLN: 500 MG/5ML | INTRAVENOUS | Qty: 5

## 2019-08-02 NOTE — Op Note (Signed)
DATE OF PROCEDURE:  08/02/2019      SURGEON:  Haskell Flirt M.D.      ASSISTANT:  none      PREOPERATIVE DIAGNOSIS:  Acute cholecystitis.      POSTOPERATIVE DIAGNOSIS:  same      OPERATION:  Laparoscopic cholecystectomy.      ANESTHESIA:  General.      ESTIMATED BLOOD LOSS:minimal      COMPLICATIONS:  None.      FLUIDS:  Crystalloid.      SPECIMEN:  Gallbladder.      DISPOSITION:  To  home.      INDICATION:  Walter Farmer is a 39 y.o. male who presented     for evaluation of right upper quadrant abdominal pain consistent with  itis. We explained the risks, benefits, potential outcomes, and   alternatives of treatment to the aforementioned procedure, including risk of   potential biliary leak or bile duct injury requiring further surgeries, infection or   bleeding requiring procedure or surgeries. HE agreed to proceed   understanding those risks and potential outcomes.      PROCEDURE:  The patient was brought into the operative suite and was given   preoperative antibiotics 30 minutes before incision, was placed under general   anesthesia, and then was prepped and draped in normal sterile condition.      Once this was done, a 5-mm incision was made supraumbilically and a Veress   needle was passed through this incision into the peritoneum.  Once this was done, CO2 was used to insufflate the abdomen to a pressure of 15 mmHg.  At that time, the Veress needle was removed and replaced with a 5-mm trocar.  The laparoscope was placed through that trocar and port, and once this was done, under direct visualization with   the laparoscope, an additional  10-mm port was placed in the subxiphoid area.   Two right lateral abdominal ports were placed, 5 mm in size, under direct   visualization with the laparoscope.      Once this was done, the gallbladder was retracted cephaladly with blunt   graspers.  Blunt dissection as well as electrocautery were able to free the   gallbladder and expose the cystic duct and artery.  These were  taken with   ligamax clip appliers, 2 distally and 1 proximally and then ligated with   Endoshears.  Electrocautery then was able to remove the gallbladder from   liver bed, snow was also placed.  Any bleeding was electrocauterized for hemostasis.  The abdomen   was  then suction irrigated until the aspirate returned clear and the   gallbladder was removed previous to this with the bag.  It was sent for   specimen at that time.      The 10-mm abdominal incision and defect in the fascia was closed in a   figure-of-8 fashion with 0 Vicryl suture.  Once this was done, the skin was   approximated with 4-0 Monocryl suture in a subcuticular fashion.  The patient   was then woken up in stable and taken to PACU.    Loreli Slot, MD  11:45 AM  08/02/2019

## 2019-08-02 NOTE — Discharge Instructions (Signed)
POST-OPERATIVE INSTRUCTIONS FOR GALLBLADDER SURGERY    ? Call the office at [phone] to schedule your post-operative appointment with Dr. Irlene Crudup for two (2) weeks.     ? You will have surgical glue closing your incisions, you may shower 24hours after your surgery but do not rub off glue, it will come off on its own over time.    ? Do not bathe for 3 days after your surgery, shower only and pat incisions dry after   Shower.      ? General guidelines for activity:    Avoid strenuous activity or lifting anything heavier than 25 pounds for 2 weeks.     It is OK to be up  walking around, and walking up and down stairs.      Do what is comfortable: stop and rest when you feel tired.     ? Drink plenty of fluids and stay on a bland diet for 2-3 days after surgery.  ? Ok for general diet     ? Do NOT drive while taking your narcotic pain medicine.     ? Watch for signs of infection:   Excessive warmth or bright redness around your incisions   Leakage of bloody or cloudy fluid from you incisions   Fever over 100.5    ? During the laparoscopic procedure that you had, gas is pumped into the abdominal cavity.  You may feel abdominal, shoulder, or rib pain for a few days due to this gas.    ? You will have pain medicine ordered. Take as directed    BOWELS: constipation is a side effect of your pain meds, take a daily laxative (miralax, dulcolax, etc.) as needed to keep your bowels moving as they normally do, do not go 2-3 days without having a bowel movement.      Pain medications; Pain meds cause constipation       It is ok to have your regular diet, without restrictions but if you develop diarrhea, cut out fatty foods of all types for 1-2 weeks and then slowly reintroduce them. Your body should adjust by that time.     No lifting over 25lbs for two week then no limitations at that time.

## 2019-08-02 NOTE — ED Notes (Signed)
Report to 3rd floor     Mardi Mainland, [place]  08/02/19 [phone]

## 2019-08-02 NOTE — H&P (Signed)
General Surgery History and Physical    Patient's Name/Date of Birth: Walter Farmer / 1980/10/06    Date: August 02, 2019     Surgeon: Haskell Flirt M.D.    PCP: No primary care provider on file.     Chief Complaint: acute cholecysititis    HPI:   Walter Farmer is a 39 y.o. male who presents for evaluation of acute cholecystitis. Timing is constant, radiation to back, alleviated by nothing and started several hours ago      Past Medical History:   Diagnosis Date   ??? GERD (gastroesophageal reflux disease)    ??? Hx of blood clots    ??? Neuropathy     right shouolder to fingers-due to a pinched nerve   ??? PE (pulmonary thromboembolism) ([facility])        Past Surgical History:   Procedure Laterality Date   ??? APPENDECTOMY     ??? KNEE SURGERY Right    ??? LAPAROSCOPIC APPENDECTOMY  03/24/2019   ??? LAPAROSCOPIC APPENDECTOMY N/A 03/24/2019    APPENDECTOMY LAPAROSCOPIC performed by Loreli Slot, MD at [facility] OR   ??? TONSILLECTOMY     ??? UPPER GASTROINTESTINAL ENDOSCOPY N/A 07/27/2019    EGD BIOPSY performed by Loreli Slot, MD at [facility] ENDOSCOPY       Current Facility-Administered Medications   Medication Dose Route Frequency Provider Last Rate Last Admin   ??? 0.9 % sodium chloride infusion  1,000 mL Intravenous Continuous Cornelia Copa, MD 100 mL/hr at 08/02/19 0818 1,000 mL at 08/02/19 0818   ??? sodium chloride flush 0.9 % injection 5-40 mL  5-40 mL Intravenous 2 times per day Cornelia Copa, MD       ??? sodium chloride flush 0.9 % injection 5-40 mL  5-40 mL Intravenous PRN Cornelia Copa, MD       ??? 0.9 % sodium chloride infusion  25 mL Intravenous PRN Cornelia Copa, MD       ??? ondansetron (ZOFRAN-ODT) disintegrating tablet 4 mg  4 mg Oral Q8H PRN Cornelia Copa, MD        Or   ??? ondansetron (ZOFRAN) injection 4 mg  4 mg Intravenous Q6H PRN Cornelia Copa, MD       ??? enoxaparin (LOVENOX) injection 40 mg  40 mg Subcutaneous Daily Cornelia Copa, MD       ??? cefTRIAXone (ROCEPHIN) 1,000 mg in sterile water 10 mL IV syringe  1,000  mg Intravenous Daily Cornelia Copa, MD       ??? metronidazole (FLAGYL) 500 mg in NaCl 100 mL IVPB premix  500 mg Intravenous Q8H Consandre Romain, MD       ??? 0.9 % sodium chloride infusion   Intravenous Continuous Cornelia Copa, MD       ??? morphine injection 4 mg  4 mg Intravenous Q2H PRN Cornelia Copa, MD           No Known Allergies    The patient has a family history that is negative for severe cardiovascular or respiratory issues, negative for reaction to anesthesia.    Social History     Socioeconomic History   ??? Marital status: Married     Spouse name: Not on file   ??? Number of children: Not on file   ??? Years of education: Not on file   ??? Highest education level: Not on file   Occupational History   ??? Not on file   Tobacco Use   ??? Smoking status: Current Every Day  Smoker     Packs/day: 1.00     Years: 10.00     Pack years: 10.00   ??? Smokeless tobacco: Former Systems developer     Types: Chew   Substance and Sexual Activity   ??? Alcohol use: Yes     Comment: weekends   ??? Drug use: Not Currently   ??? Sexual activity: Yes   Other Topics Concern   ??? Not on file   Social History Narrative   ??? Not on file     Social Determinants of Health     Financial Resource Strain:    ??? Difficulty of Paying Living Expenses:    Food Insecurity:    ??? Worried About Charity fundraiser in the Last Year:    ??? Arboriculturist in the Last Year:    Transportation Needs:    ??? Film/video editor (Medical):    ??? Lack of Transportation (Non-Medical):    Physical Activity:    ??? Days of Exercise per Week:    ??? Minutes of Exercise per Session:    Stress:    ??? Feeling of Stress :    Social Connections:    ??? Frequency of Communication with Friends and Family:    ??? Frequency of Social Gatherings with Friends and Family:    ??? Attends Religious Services:    ??? Marine scientist or Organizations:    ??? Attends Music therapist:    ??? Marital Status:    Human resources officer Violence:    ??? Fear of Current or Ex-Partner:    ??? Emotionally  Abused:    ??? Physically Abused:    ??? Sexually Abused:            Review of Systems  Review of Systems -  General ROS: negative for - chills, fatigue or malaise  ENT ROS: negative for - hearing change, nasal congestion or nasal discharge  Allergy and Immunology ROS: negative for - hives, itchy/watery eyes or nasal congestion  Hematological and Lymphatic ROS: negative for - blood clots, blood transfusions, bruising or fatigue  Endocrine ROS: negative for - malaise/lethargy, mood swings, palpitations or polydipsia/polyuria  Breast ROS: negative for - new or changing breast lumps or nipple changes  Respiratory ROS: negative for - sputum changes, stridor, tachypnea or wheezing  Cardiovascular ROS: negative for - irregular heartbeat, loss of consciousness, murmur or orthopnea  Gastrointestinal ROS: negative for - constipation, diarrhea, gas/bloating, heartburn or hematemesis  Genito-Urinary ROS: negative for -  genital discharge, genital ulcers or hematuria  Musculoskeletal ROS: negative for - gait disturbance, muscle pain or muscular weakness    Physical exam:   BP 118/88    Pulse 66    Temp 97.6 ??F (36.4 ??C) (Oral)    Resp 16    Wt 212 lb (96.2 kg)    SpO2 98%    BMI 32.23 kg/m??   General appearance:  NAD  Pyscho/social: neg for tremors and hallucinations  Head: NCAT, PERRLA, EOMI, red conjunctiva  Neck: supple, no masses  Lungs: CTAB, equal chest rise bilateral  Heart: Reg rate  Abdomen: soft, moderately tender in RUQ, nondistended  Skin; no lesions  Gu: no cva tenderness  Extremities: extremities normal, atraumatic, no cyanosis or edema      Radiology:  USG:  itis    Assessment:  39 y.o. male with acute cholecystitis    Plan:  To OR  Discussed the risk, benefits and alternatives of surgery including wound  infections, bleeding, scar and hernia formation and the risks of general anesthetic including MI, CVA, sudden death or reactions to anesthetic medications. The patient understands the risks and alternatives and the  possibility of converting to an open procedure. All questions were answered to the patient's satisfaction and they freely signed the consent.      Loreli Slot, MD  9:07 AM  08/02/2019

## 2019-08-02 NOTE — ED Notes (Signed)
Aware of NPO status, last by mouth 0300     Mardi Mainland, RN  08/02/19 [phone]

## 2019-08-02 NOTE — ED Provider Notes (Signed)
39 year old male presents emergency department with complaint of right upper quadrant pain and states he was scheduled wrongly for surgery.  He states he was just to be have surgery today this morning with Dr. Hassell Done however when he came in to have surgery they stated that he was scheduled for September 7 and he would have to come to the emergency department.  He is having significant right upper quadrant pain from his cholecystitis.  Has had already full work-up for this including presurgical testing, HIDA scan.  He denies any fevers chills cough congestion runny nose chest pain shortness of breath change the bowel bladder habits.    The patient presents with ruq pain that has been going on for off and on for 1 year. These symptoms are moderate in severity. Symptoms are made better by nothing. Symptoms are made worse by nothing. Associated symptoms include none.      The history is provided by the patient and medical records.        Review of Systems   Constitutional: Negative for chills and fever.   HENT: Negative for ear pain, sinus pressure and sore throat.    Eyes: Negative for pain, discharge and redness.   Respiratory: Negative for cough, shortness of breath and wheezing.    Cardiovascular: Negative for chest pain.   Gastrointestinal: Positive for abdominal pain. Negative for abdominal distention, blood in stool, constipation, diarrhea, nausea and vomiting.   Genitourinary: Negative for dysuria and frequency.   Musculoskeletal: Negative for arthralgias and back pain.   Skin: Negative for rash and wound.   Neurological: Negative for weakness and headaches.   Hematological: Negative for adenopathy.   All other systems reviewed and are negative.       Physical Exam  Vitals and nursing note reviewed.   Constitutional:       Appearance: He is well-developed.   HENT:      Head: Normocephalic and atraumatic.   Eyes:      Conjunctiva/sclera: Conjunctivae normal.   Cardiovascular:      Rate and Rhythm: Normal rate  and regular rhythm.      Heart sounds: Normal heart sounds. No murmur heard.     Pulmonary:      Effort: Pulmonary effort is normal. No respiratory distress.      Breath sounds: Normal breath sounds. No wheezing or rales.   Abdominal:      General: Bowel sounds are normal. There is no distension.      Palpations: Abdomen is soft.      Tenderness: There is abdominal tenderness in the right upper quadrant. There is no guarding or rebound. Positive signs include Murphy's sign. Negative signs include Rovsing's sign and McBurney's sign.      Comments: Significant right upper quadrant pain and positive Murphy sign   Musculoskeletal:         General: No swelling, tenderness or deformity.      Cervical back: Normal range of motion and neck supple.   Skin:     General: Skin is warm and dry.      Coloration: Skin is not jaundiced.   Neurological:      General: No focal deficit present.      Mental Status: He is alert and oriented to person, place, and time.   Psychiatric:         Mood and Affect: Mood normal.         Thought Content: Thought content normal.  Procedures     MDM     39-year male presented emerged department complaint of right upper quadrant abdominal pain and needing of surgery for his cholecystitis.  He states he was supposed to have surgery today with Dr. Daphine Deutscher however the scheduler messed up surgery and accidentally scheduled him for September 7 instead of today June 7.  Due to this I did talk to Dr. Keene Breath about the patient and his scheduling mixup.  He did ask for the patient get a CBC CMP as well and that the patient would be admitted and hopefully be able to get scheduled for surgery either today or tomorrow.  Provide patient pain control here in emergency department.  Patient was agreeable with family to the hospital because he stated he needed surgery and was in significant amount of pain.  Patient agreeable with the plan of admission for cholecystectomy.    ED Course as of Aug 01 901   Mon  Aug 02, 2019   8099 Spoke with general surgery Dr. Keene Breath about the patient needing surgery for his gallbladder and that there is a mistake in the scheduling and he was was to have a today however scheduler scheduled him for September 7.  Dr. Quincy Sheehan did asked to have CBC CMP on the patient and they will bring him into the hospital and try to get him scheduled for surgery today.    [CB]   0808   ATTENDING PROVIDER ATTESTATION:     I have personally performed and/or participated in the history, exam, medical decision making, and procedures and agree with all pertinent clinical information unless otherwise noted.    I have also reviewed and agree with the past medical, family and social history unless otherwise noted.    I have discussed this patient in detail with the resident and provided the instruction and education regarding the evidence-based evaluation and treatment of gallbladder disease.    History: patient scheduled for cholecystectomy had an issue getting the OR schedule lined up.  He has associated nausea vomiting.  No fever or chills.    My findings: Walter Farmer is a 39 y.o. male whom is in no distress. Physical exam reveals heart RRR, lungs CTA, abdomen is soft and tender in the RUQ.    My plan: Symptomatic and supportive care. Arrangements made for admission for cholecystectomy.    Electronically signed by Susy Manor, DO on 08/02/19 at 8:08 AM EDT          [JS]      ED Course User Index  [CB] Alvester Chou, MD  [JS] Susy Manor, DO       ED Course as of Aug 01 901   Mon Aug 02, 2019   8338 Spoke with general surgery Dr. Keene Breath about the patient needing surgery for his gallbladder and that there is a mistake in the scheduling and he was was to have a today however scheduler scheduled him for September 7.  Dr. Quincy Sheehan did asked to have CBC CMP on the patient and they will bring him into the hospital and try to get him scheduled for surgery today.    [CB]   0808   ATTENDING PROVIDER  ATTESTATION:     I have personally performed and/or participated in the history, exam, medical decision making, and procedures and agree with all pertinent clinical information unless otherwise noted.    I have also reviewed and agree with the past medical, family and social history unless otherwise noted.  I have discussed this patient in detail with the resident and provided the instruction and education regarding the evidence-based evaluation and treatment of gallbladder disease.    History: patient scheduled for cholecystectomy had an issue getting the OR schedule lined up.  He has associated nausea vomiting.  No fever or chills.    My findings: Walter Farmer is a 39 y.o. male whom is in no distress. Physical exam reveals heart RRR, lungs CTA, abdomen is soft and tender in the RUQ.    My plan: Symptomatic and supportive care. Arrangements made for admission for cholecystectomy.    Electronically signed by Susy Manor, DO on 08/02/19 at 8:08 AM EDT          [JS]      ED Course User Index  [CB] Alvester Chou, MD  [JS] Susy Manor, DO       --------------------------------------------- PAST HISTORY ---------------------------------------------  Past Medical History:  has a past medical history of GERD (gastroesophageal reflux disease), Hx of blood clots, Neuropathy, and PE (pulmonary thromboembolism) (HCC).    Past Surgical History:  has a past surgical history that includes Tonsillectomy; knee surgery (Right); laparoscopic appendectomy (03/24/2019); laparoscopic appendectomy (N/A, 03/24/2019); Appendectomy; and Upper gastrointestinal endoscopy (N/A, 07/27/2019).    Social History:  reports that he has been smoking. He has a 10.00 pack-year smoking history. He has quit using smokeless tobacco.  His smokeless tobacco use included chew. He reports current alcohol use. He reports previous drug use.    Family History: family history includes COPD in his mother; Diabetes in his father; High Blood  Pressure in his mother; High Cholesterol in his mother; Mult Sclerosis in his paternal grandmother; No Known Problems in his paternal grandfather; Obesity in his mother; Other in his brother and mother; Scoliosis in his sister; Stroke in his mother.     The patient???s home medications have been reviewed.    Allergies: Patient has no known allergies.    -------------------------------------------------- RESULTS -------------------------------------------------    LABS:  Results for orders placed or performed during the hospital encounter of 08/02/19   CBC Auto Differential   Result Value Ref Range    WBC 9.7 4.5 - 11.5 E9/L    RBC 4.75 3.80 - 5.80 E12/L    Hemoglobin 14.5 12.5 - 16.5 g/dL    Hematocrit 35.0 09.3 - 54.0 %    MCV 93.3 80.0 - 99.9 fL    MCH 30.5 26.0 - 35.0 pg    MCHC 32.7 32.0 - 34.5 %    RDW 13.4 11.5 - 15.0 fL    Platelets 298 130 - 450 E9/L    MPV 9.0 7.0 - 12.0 fL    Neutrophils % 49.2 43.0 - 80.0 %    Immature Granulocytes % 0.2 0.0 - 5.0 %    Lymphocytes % 35.3 20.0 - 42.0 %    Monocytes % 9.9 2.0 - 12.0 %    Eosinophils % 4.8 0.0 - 6.0 %    Basophils % 0.6 0.0 - 2.0 %    Neutrophils Absolute 4.77 1.80 - 7.30 E9/L    Immature Granulocytes # 0.02 E9/L    Lymphocytes Absolute 3.42 1.50 - 4.00 E9/L    Monocytes Absolute 0.96 (H) 0.10 - 0.95 E9/L    Eosinophils Absolute 0.47 0.05 - 0.50 E9/L    Basophils Absolute 0.06 0.00 - 0.20 E9/L       RADIOLOGY:  No orders to display     ------------------------- NURSING NOTES AND VITALS REVIEWED ---------------------------  Date / Time Roomed:  08/02/2019  7:14 AM  ED Bed Assignment:  [id number]/0321-01    The nursing notes within the ED encounter and vital signs as below have been reviewed.     Patient Vitals for the past 24 hrs:   BP Temp Temp src Pulse Resp SpO2 Weight   08/02/19 0859 118/88 97.6 ??F (36.4 ??C) Oral 66 16 98 % --   08/02/19 0833 -- -- -- 58 16 98 % --   08/02/19 0814 108/74 -- -- -- -- 98 % --   08/02/19 0734 -- -- -- -- -- -- 212 lb (96.2 kg)    08/02/19 0714 121/81 -- -- -- -- -- --   08/02/19 0713 -- 97.8 ??F (36.6 ??C) -- 57 16 99 % --       Oxygen Saturation Interpretation: Normal    ------------------------------------------ PROGRESS NOTES ------------------------------------------  Re-evaluation(s):  Time: 0745  Patient???s symptoms show no change  Repeat physical examination is not changed    Counseling:  I have spoken with the patient and wife and discussed today???s results, in addition to providing specific details for the plan of care and counseling regarding the diagnosis and prognosis.  Their questions are answered at this time and they are agreeable with the plan of admission.    --------------------------------- ADDITIONAL PROVIDER NOTES ---------------------------------  Consultations:  [id number] . Spoke with Dr. Keene Breath.  Discussed case.  They will admit the patient.  This patient's ED course included: a personal history and physicial examination, re-evaluation prior to disposition, multiple bedside re-evaluations, IV medications, cardiac monitoring and continuous pulse oximetry    This patient has remained hemodynamically stable during their ED course.    Diagnosis:  1. Cholecystitis        Disposition:  Patient's disposition: Admit to med/surg floor  Patient's condition is stable.         Alvester Chou, MD  Resident  08/02/19 [phone]

## 2019-08-05 ENCOUNTER — Encounter: Attending: Surgery

## 2019-08-16 ENCOUNTER — Ambulatory Visit: Admit: 2019-08-16 | Discharge: 2019-08-16 | Payer: PRIVATE HEALTH INSURANCE | Attending: Surgery

## 2019-08-16 DIAGNOSIS — K8020 Calculus of gallbladder without cholecystitis without obstruction: Secondary | ICD-10-CM
